# Patient Record
Sex: Female | Born: 2000
Health system: Southern US, Community
[De-identification: ages and names within clinical notes are randomized; demographics above are authoritative.]

## PROBLEM LIST (undated history)

## (undated) DIAGNOSIS — F909 Attention-deficit hyperactivity disorder, unspecified type: Secondary | ICD-10-CM

## (undated) DIAGNOSIS — L0231 Cutaneous abscess of buttock: Secondary | ICD-10-CM

## (undated) DIAGNOSIS — J45909 Unspecified asthma, uncomplicated: Secondary | ICD-10-CM

## (undated) DIAGNOSIS — O24419 Gestational diabetes mellitus in pregnancy, unspecified control: Secondary | ICD-10-CM

## (undated) HISTORY — DX: Gestational diabetes mellitus in pregnancy, unspecified control: O24.419

## (undated) HISTORY — PX: INCISION AND DRAINAGE ABSCESS: SHX5864

## (undated) HISTORY — PX: TONSILLECTOMY: SUR1361

## (undated) HISTORY — PX: DENTAL SURGERY: SHX609

---
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
## 2020-08-18 IMAGING — US US MFM FETAL BPP W/O NON-STRESS
1 series · 15 of 28 positions shown · non-contrast
Comparison: none

[Series 1: us mfm fetal bpp w/o non-stress · 30 acquisitions, 15 frames shown]
[im 1/30]
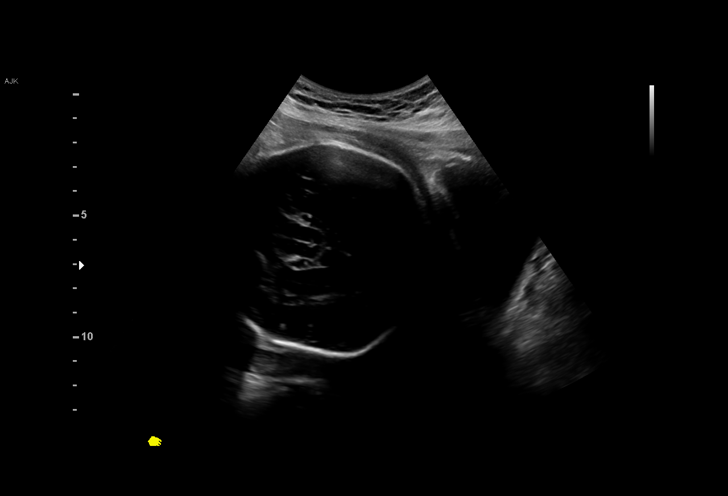
[im 3/30]
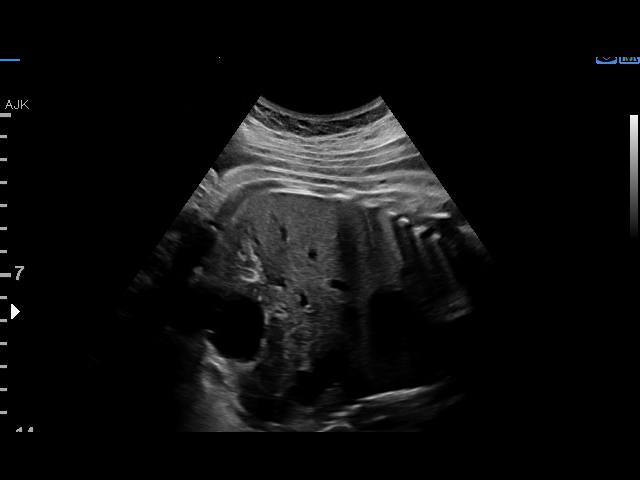
[im 5/30]
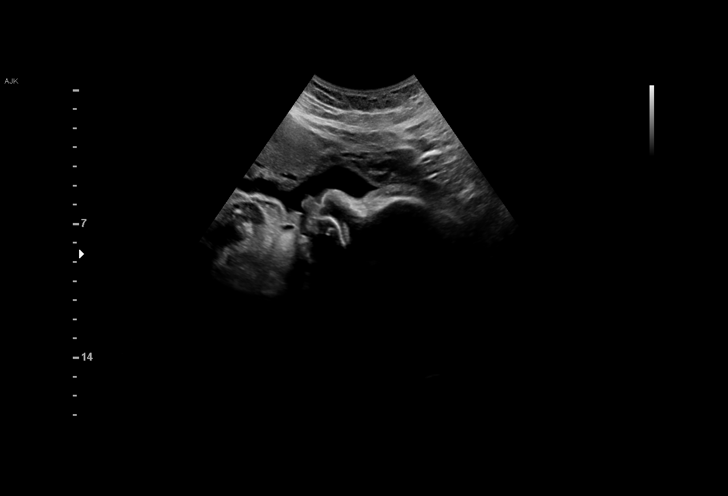
[im 7/30]
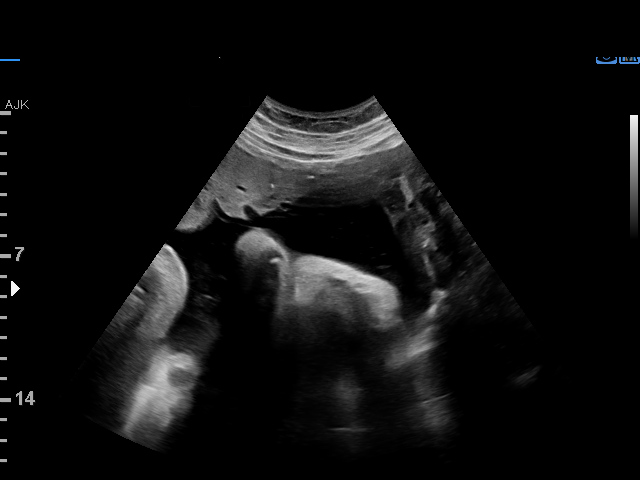
[im 9/30]
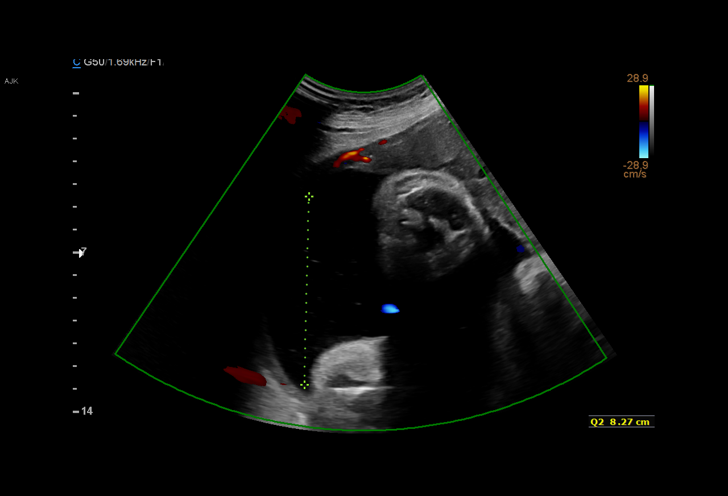
[im 11/30]
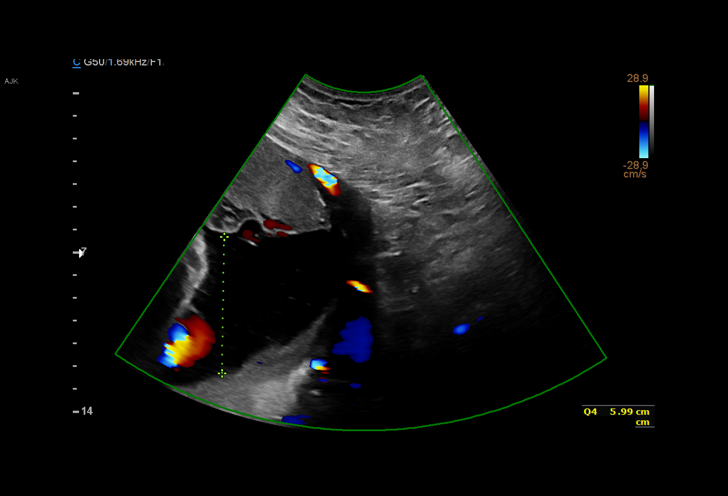
[im 13/30]
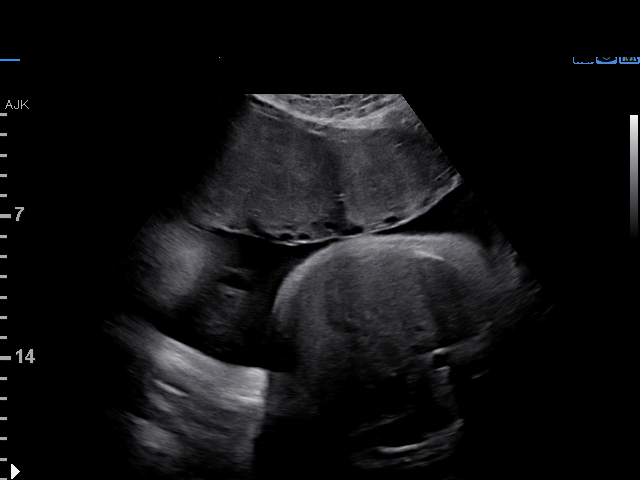
[im 16/30]
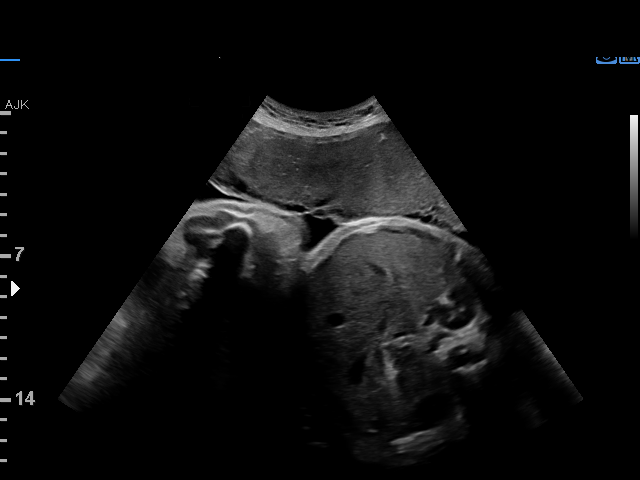
[im 17/30]
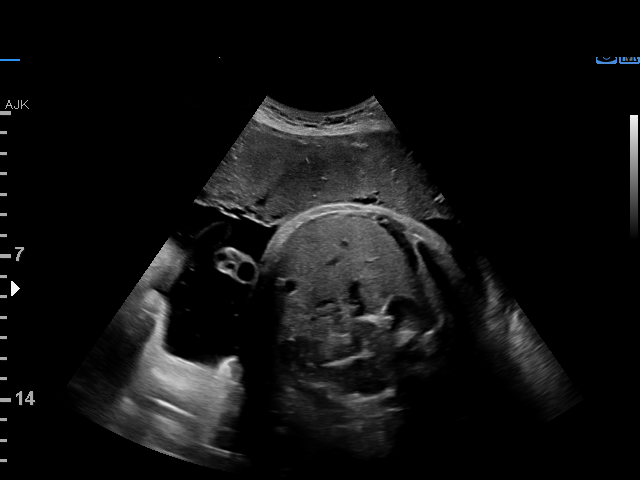
[im 19/30]
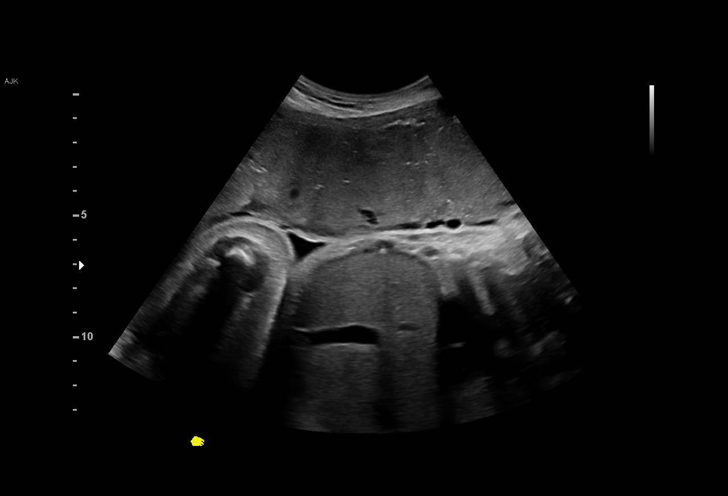
[im 21/30]
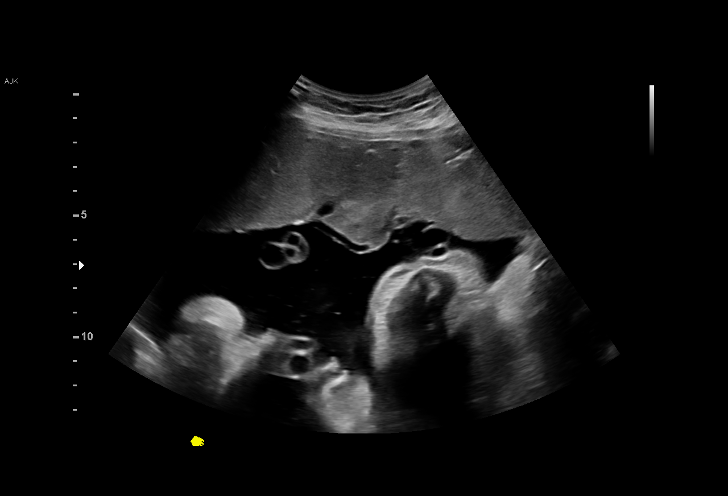
[im 23/30]
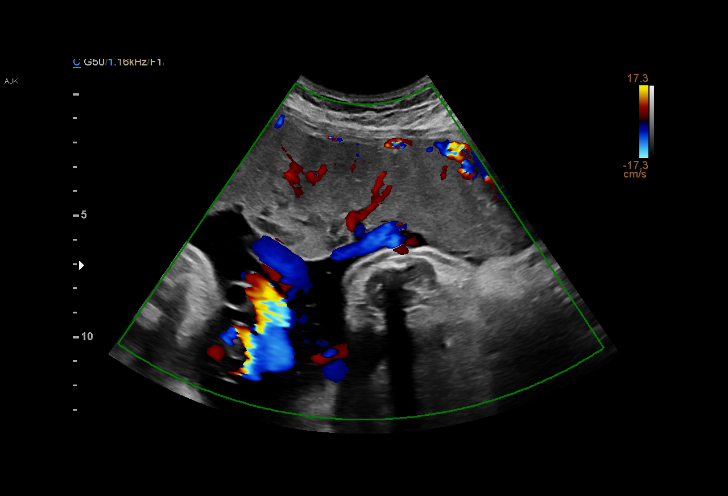
[im 25/30]
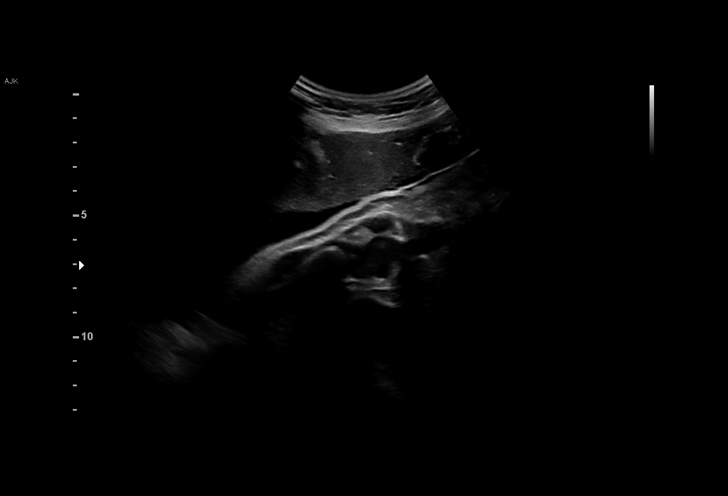
[im 27/30]
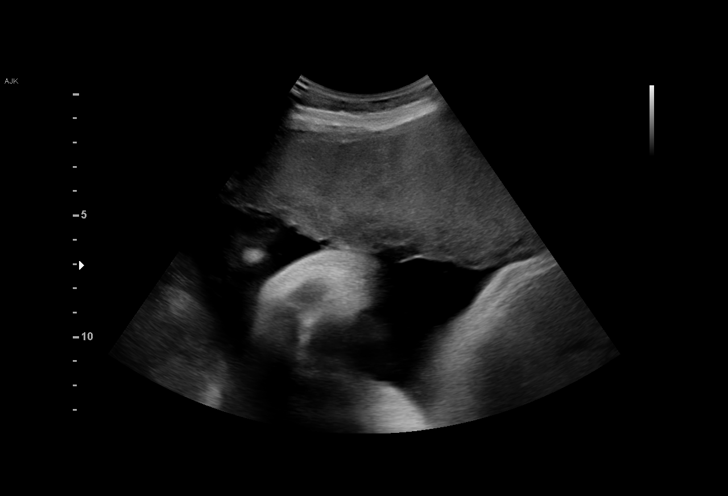
[im 30/30]
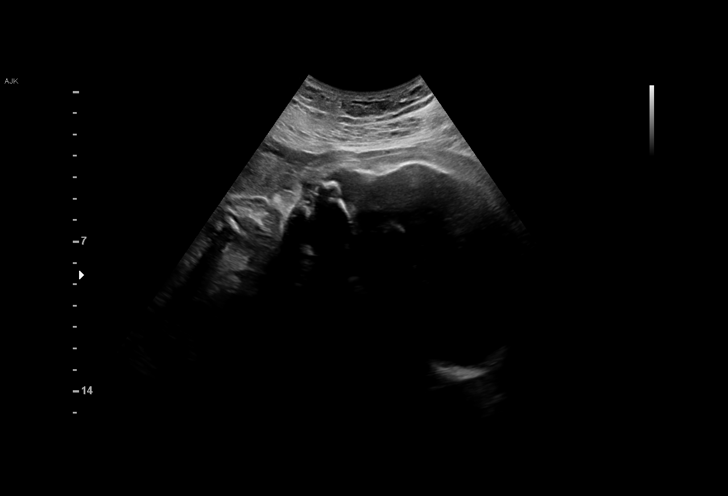

[15 of 28 positions shown; findings below may reference images not displayed]

Attending:        Kotachi J Pando      Secondary Phy.:    [facility]
                                                             Care

Indications

 35 weeks gestation of pregnancy
 Gestational diabetes in pregnancy, diet
 controlled
 Polyhydramnios, third trimester, antepartum
 condition or complication, unspecified fetus
 Genetic carrier (Leydis Gaitan)
 Uterine size-date discrepancy, third trimester
 Asthma                                          5MM.VM [id number]
Fetal Evaluation

 Num Of Fetuses:          1
 Fetal Heart Rate(bpm):   142
 Cardiac Activity:        Observed
 Presentation:            Cephalic
 Placenta:                Anterior
 P. Cord Insertion:       Visualized, central

 Amniotic Fluid
 AFI FV:      Within normal limits

 AFI Sum(cm)     %Tile       Largest Pocket(cm)
 21.9            83

 RUQ(cm)       RLQ(cm)       LUQ(cm)        LLQ(cm)
 5.3           6

 Comment:    Stomach, bladder, and diaphragm seen.
Biophysical Evaluation

 Amniotic F.V:   Within normal limits       F. Tone:         Observed
 F. Movement:    Observed                   Score:           [DATE]
 F. Breathing:   Observed
OB History

 Gravidity:    1         Term:   0        Prem:   0        SAB:   0
 TOP:          0       Ectopic:  0        Living: 0
Gestational Age

 LMP:           35w 3d        Date:  11/13/18                 EDD:   08/20/19
 Best:          35w 3d     Det. By:  LMP  (11/13/18)          EDD:   08/20/19
Impression

 Biphysical profile performed due to GDM and polyhydramnios
 Was on insulin earlier in the pregnancy but discontinued due
 to improved blood glucose levels.
 Biophysical profile [DATE]
 Good fetal movement and aminotic fluid
Recommendations

 Follow up growth and BPP scheduled on [DATE]

## 2020-08-23 ENCOUNTER — Ambulatory Visit (INDEPENDENT_AMBULATORY_CARE_PROVIDER_SITE_OTHER): Payer: Medicaid Other

## 2020-08-23 DIAGNOSIS — Z3201 Encounter for pregnancy test, result positive: Secondary | ICD-10-CM

## 2020-08-23 LAB — POCT PREGNANCY, URINE: Preg Test, Ur: POSITIVE — AB

## 2020-08-23 MED ORDER — PRENATAL PLUS 27-1 MG PO TABS: 1.0000 | ORAL_TABLET | Freq: Every day | ORAL | 6 refills | Status: AC

## 2020-08-23 NOTE — Progress Notes (Signed)
I have reviewed the findings and care plan as documented in the RN note.    Casper Harrison, MD [facility] Family Medicine Fellow, [facility] for [facility], [facility]

## 2020-08-23 NOTE — Progress Notes (Signed)
Pt dropped off urine for UPT. Called pt and Spoke with pt. Pt given results that UPT was positive in office today.Pt verbalized understanding. Pt states has taken 2 home UPT that were positive. Pt advised to call and make new OB appt with [facility]. Pt verbalized understanding.   LMP: 06/18/20 EDD 03/25/2021 [redacted]w[redacted]d today   Pt denies any cramps, abd pain and vaginal bleeding. Ectopic precautions advised and when to go to [facility]. Pt agreeable to plan of care. Pt advised to start taking PNV. Pt requesting PNV Rx. Sent to pharmacy on file.   Judeth Cornfield, RN

## 2020-09-04 ENCOUNTER — Other Ambulatory Visit: Payer: Self-pay

## 2020-09-04 ENCOUNTER — Telehealth (INDEPENDENT_AMBULATORY_CARE_PROVIDER_SITE_OTHER): Payer: Medicaid Other | Admitting: *Deleted

## 2020-09-04 DIAGNOSIS — O0990 Supervision of high risk pregnancy, unspecified, unspecified trimester: Secondary | ICD-10-CM | POA: Insufficient documentation

## 2020-09-04 DIAGNOSIS — Z3A00 Weeks of gestation of pregnancy not specified: Secondary | ICD-10-CM

## 2020-09-04 DIAGNOSIS — Z8759 Personal history of other complications of pregnancy, childbirth and the puerperium: Secondary | ICD-10-CM

## 2020-09-04 DIAGNOSIS — O99419 Diseases of the circulatory system complicating pregnancy, unspecified trimester: Secondary | ICD-10-CM

## 2020-09-04 DIAGNOSIS — Z8632 Personal history of gestational diabetes: Secondary | ICD-10-CM | POA: Insufficient documentation

## 2020-09-04 DIAGNOSIS — O09299 Supervision of pregnancy with other poor reproductive or obstetric history, unspecified trimester: Secondary | ICD-10-CM | POA: Insufficient documentation

## 2020-09-04 DIAGNOSIS — I471 Supraventricular tachycardia, unspecified: Secondary | ICD-10-CM

## 2020-09-04 HISTORY — DX: Supervision of high risk pregnancy, unspecified, unspecified trimester: O09.90

## 2020-09-04 MED ORDER — BLOOD PRESSURE KIT DEVI: 1.0000 | 0 refills | Status: DC | PRN

## 2020-09-04 MED ORDER — GOJJI WEIGHT SCALE MISC: 1.0000 | 0 refills | Status: DC | PRN

## 2020-09-04 NOTE — Progress Notes (Signed)
11:23 patient not connected for virtual visit. I called her and asked if she is ready and gave her instructions to join. Antolin Belsito,RN  New OB Intake  I connected with  Lenord Carbo [place] on 09/04/20 at 11:25 by MyChart Video Visit and verified that I am speaking with the correct person using two identifiers. Nurse is located at [facility] and pt is located in her car.  I discussed the limitations, risks, security and privacy concerns of performing an evaluation and management service by telephone and the availability of in person appointments. I also discussed with the patient that there may be a patient responsible charge related to this service. The patient expressed understanding and agreed to proceed.  I explained I am completing New OB Intake today. We discussed her EDD of 03/25/21 that is based on LMP of 06/18/20. Pt is G2/P1001. I reviewed her allergies, medications, Medical/Surgical/OB history, and appropriate screenings. I informed her of [facility] services. Based on history, this is a/an  pregnancy complicated by history of either GHT or Preeclampsia and hx of GDM.   And SVT.   Patient Active Problem List   Diagnosis Date Noted   Supervision of high risk pregnancy, antepartum 09/04/2020   History of gestational hypertension 09/04/2020   History of gestational diabetes mellitus (GDM) in prior pregnancy, currently pregnant 09/04/2020   SVT (supraventricular tachycardia) (HCC) 06/21/2019   Alpha thalassemia silent carrier 04/06/2019    Concerns addressed today  Of note was having some issues with phone battery and reception during visit so visit was expedited.  Delivery Plans:  Plans to deliver at [facility].   MyChart/Babyscripts MyChart access verified. I explained pt will have some visits in office and some virtually. Babyscripts instructions given and order placed.   Blood Pressure Cuff  Blood pressure cuff ordered for patient to pick-up from [employer]. Explained after first  prenatal appt pt will check weekly and document in Babyscripts.  Weight scale: Patient  does not have weight scale. Weight scale ordered for patient to pick up.   Anatomy US Explained first scheduled [place] will be around 19 weeks and patient notified by MyChart.   Labs Discussed Avelina Laine genetic screening with patient. Declines  both Panorama and Horizon drawn at new OB visit. Routine prenatal labs needed.  Covid Vaccine Patient has not had covid vaccine.   Mother/ Baby Dyad Candidate?   NO If yes, offer as possibility  Inform patient of [facility] and place . In AVS   Social Determinants of Health Food Insecurity: Patient denies food insecurity. WIC Referral: Patient is not interested in referral to [facility]. States she has WIC.  Transportation: Patient denies transportation needs. Childcare: Discussed no children allowed at ultrasound appointments. Offered childcare services; patient declines childcare services at this time.  First visit review I reviewed new OB appt with pt. I explained she will have a pelvic exam, ob bloodwork , and PAP smear. NOted during visit was scheduled to see Midwife, changed to MD due to high risk pregnancy noted. Explained pt will be seen by Dr. Crissie Reese at first visit; encounter routed to appropriate provider. Explained that patient will be seen by pregnancy navigator following visit with provider. [facility] information placed in AVS.   Breindel Collier,RN 09/04/2020  12:09 PM

## 2020-09-04 NOTE — Patient Instructions (Addendum)
  At our [facility] OB/GYN Practices, we work as an integrated team, providing care to address both physical and emotional health. Your medical provider may refer you to see our Behavioral Health Clinician [facility]) on the same day you see your medical provider, as availability permits; often scheduled virtually at your convenience.  Our [facility] is available to all patients, visits generally last between 20-30 minutes, but can be longer or shorter, depending on patient need. The [facility] offers help with stress management, coping with symptoms of depression and anxiety, major life changes , sleep issues, changing risky behavior, grief and loss, life stress, working on personal life goals, and  behavioral health issues, as these all affect your overall health and wellness.  The [facility] is NOT available for the following: FMLA paperwork, court-ordered evaluations, specialty assessments (custody or disability), letters to employers, or obtaining certification for an emotional support animal. The [facility] does not provide long-term therapy. You have the right to refuse integrated behavioral health services, or to reschedule to see the [facility] at a later date.  Confidentiality exception: If it is suspected that a child or disabled adult is being abused or neglected, we are required by law to report that to either Child Protective Services or Adult Management consultant.  If you have a diagnosis of Bipolar affective disorder, Schizophrenia, or recurrent Major depressive disorder, we will recommend that you establish care with a psychiatrist, as these are lifelong, chronic conditions, and we want your overall emotional health and medications to be more closely monitored. If you anticipate needing extended maternity leave due to mental health issues postpartum, it it recommended you inform your medical provider, so we can put in a referral to a psychiatrist as soon as possible. The [facility] is unable to recommend an extended maternity leave for mental  health issues. Your medical provider or [facility] may refer you to a therapist for ongoing, traditional therapy, or to a psychiatrist, for medication management, if it would benefit your overall health. Depending on your insurance, you may have a copay or be charged a deductible, depending on your insurance, to see the [facility]. If you are uninsured, it is recommended that you apply for financial assistance. (Forms may be requested at the front desk for in-person visits, via MyChart, or request a form during a virtual visit).  If you see the [facility] more than 6 times, you will have to complete a comprehensive clinical assessment interview with the [facility] to resume integrated services.  For virtual visits with the [facility], you must be physically in the state of [place] at the time of the visit. For example, if you live in [place], you will have to do an in-person visit with the [facility], and your out-of-state insurance may not cover behavioral health services in [place]. If you are going out of the state or country for any reason, the [facility] may see you virtually when you return to [place], but not while you are physically outside of [place].     [email or web address] is a Chief Technology Officer for pregnancy

## 2020-09-14 ENCOUNTER — Encounter (HOSPITAL_COMMUNITY): Payer: Self-pay | Admitting: Family Medicine

## 2020-09-14 ENCOUNTER — Inpatient Hospital Stay (HOSPITAL_COMMUNITY)
Admission: AD | Admit: 2020-09-14 | Discharge: 2020-09-15 | Disposition: A | Discharge status: Home / Self Care | Payer: Medicaid Other | Attending: Family Medicine | Admitting: Family Medicine

## 2020-09-14 DIAGNOSIS — O210 Mild hyperemesis gravidarum: Secondary | ICD-10-CM | POA: Diagnosis not present

## 2020-09-14 DIAGNOSIS — Z7722 Contact with and (suspected) exposure to environmental tobacco smoke (acute) (chronic): Secondary | ICD-10-CM | POA: Diagnosis not present

## 2020-09-14 DIAGNOSIS — Z8759 Personal history of other complications of pregnancy, childbirth and the puerperium: Secondary | ICD-10-CM

## 2020-09-14 DIAGNOSIS — O0990 Supervision of high risk pregnancy, unspecified, unspecified trimester: Secondary | ICD-10-CM

## 2020-09-14 DIAGNOSIS — Z79899 Other long term (current) drug therapy: Secondary | ICD-10-CM | POA: Diagnosis not present

## 2020-09-14 DIAGNOSIS — O219 Vomiting of pregnancy, unspecified: Secondary | ICD-10-CM | POA: Insufficient documentation

## 2020-09-14 DIAGNOSIS — Z3A12 12 weeks gestation of pregnancy: Secondary | ICD-10-CM | POA: Diagnosis not present

## 2020-09-14 DIAGNOSIS — O09299 Supervision of pregnancy with other poor reproductive or obstetric history, unspecified trimester: Secondary | ICD-10-CM

## 2020-09-14 LAB — CBC
HCT: 46.4 % — ABNORMAL HIGH (ref 36.0–46.0)
Hemoglobin: 15.2 g/dL — ABNORMAL HIGH (ref 12.0–15.0)
MCH: 23.7 pg — ABNORMAL LOW (ref 26.0–34.0)
MCHC: 32.8 g/dL (ref 30.0–36.0)
MCV: 72.4 fL — ABNORMAL LOW (ref 80.0–100.0)
Platelets: 385 10*3/uL (ref 150–400)
RBC: 6.41 MIL/uL — ABNORMAL HIGH (ref 3.87–5.11)
RDW: 15 % (ref 11.5–15.5)
WBC: 15.7 10*3/uL — ABNORMAL HIGH (ref 4.0–10.5)
nRBC: 0 % (ref 0.0–0.2)

## 2020-09-14 LAB — COMPREHENSIVE METABOLIC PANEL
ALT: 22 U/L (ref 0–44)
AST: 22 U/L (ref 15–41)
Albumin: 4.3 g/dL (ref 3.5–5.0)
Alkaline Phosphatase: 59 U/L (ref 38–126)
Anion gap: 13 (ref 5–15)
BUN: 8 mg/dL (ref 6–20)
CO2: 17 mmol/L — ABNORMAL LOW (ref 22–32)
Calcium: 9.8 mg/dL (ref 8.9–10.3)
Chloride: 107 mmol/L (ref 98–111)
Creatinine, Ser: 0.63 mg/dL (ref 0.44–1.00)
GFR, Estimated: >60 mL/min (ref >60)
Glucose, Bld: 77 mg/dL (ref 70–99)
Potassium: 3.8 mmol/L (ref 3.5–5.1)
Sodium: 137 mmol/L (ref 135–145)
Total Bilirubin: 0.6 mg/dL (ref 0.3–1.2)
Total Protein: 7.8 g/dL (ref 6.5–8.1)

## 2020-09-14 LAB — URINALYSIS, ROUTINE W REFLEX MICROSCOPIC
Bilirubin Urine: NEGATIVE
Glucose, UA: NEGATIVE mg/dL
Hgb urine dipstick: NEGATIVE
Ketones, ur: 80 mg/dL — AB
Leukocytes,Ua: NEGATIVE
Nitrite: NEGATIVE
Protein, ur: 30 mg/dL — AB
Specific Gravity, Urine: 1.033 — ABNORMAL HIGH (ref 1.005–1.030)
pH: 6 (ref 5.0–8.0)

## 2020-09-14 MED ORDER — LACTATED RINGERS IV BOLUS
1000.0000 mL | Freq: Once | INTRAVENOUS | Status: AC
  Administered 2020-09-14: 1000 mL via INTRAVENOUS

## 2020-09-14 MED ORDER — SODIUM CHLORIDE 0.9 % IV SOLN
25.0000 mg | Freq: Once | INTRAVENOUS | Status: AC
  Administered 2020-09-14: 25 mg via INTRAVENOUS
  Filled 2020-09-14: qty 1

## 2020-09-14 NOTE — MAU Note (Signed)
Unable to keep down anything today. Have a headache behind my eyes and some lower abd cramping. Denies VB or d/c. Occ feel like my heart is "jumping" and sometimes my chest is tight.

## 2020-09-15 MED ORDER — PROMETHAZINE HCL 25 MG PO TABS: 25.0000 mg | ORAL_TABLET | Freq: Four times a day (QID) | ORAL | 1 refills | Status: DC | PRN

## 2020-09-15 NOTE — Progress Notes (Signed)
WRitten and verbal d/c instructions given and understanding vocied.

## 2020-09-15 NOTE — MAU Provider Note (Signed)
Chief Complaint:  Emesis During Pregnancy   Event Date/Time   First Provider Initiated Contact with Patient 09/14/20 2244     HPI: Natalie Pacheco is a 20 y.o. G2P1001 at [redacted]w[redacted]d who presents to maternity admissions reporting nausea and vomiting that started about 2 weeks ago, however today it has worsened and she reports she cannot keep any PO fluids down. She reports she has tried to drink water, juice, soda. She is not currently on any medications for nausea. She denies abdominal pain, vaginal bleeding, urinary s/s, fever.    Pregnancy Course:   Past Medical History:  Diagnosis Date   Abscess of buttock    ADHD (attention deficit hyperactivity disorder)    Asthma    DKA (diabetic ketoacidosis) ([place]) 06/16/2019   Gestational diabetes    OB History  Gravida Para Term Preterm AB Living  $Remov'2 1 1     1  'wKxaJu$ SAB IAB Ectopic Multiple Live Births        0 1    # Outcome Date GA Lbr Len/2nd Weight Sex Delivery Anes PTL Lv  2 Current           1 Term 08/10/19 [redacted]w[redacted]d 16:12 / 07:17 3751 g F Vag-Vacuum EPI  LIV   Past Surgical History:  Procedure Laterality Date   CYST EXCISION N/A 07/17/2017   Procedure: EXCISION OF INFECTED GLUTEAL CYST;  Surgeon: Clovis Riley, MD;  Location: [facility];  Service: General;  Laterality: N/A;   DENTAL SURGERY     INCISION AND DRAINAGE ABSCESS     Buttocks   TONSILLECTOMY     History reviewed. No pertinent family history. Social History   Tobacco Use   Smoking status: Never    Passive exposure: Yes   Smokeless tobacco: Never   Tobacco comments:    Mother smokes outside of the home  Vaping Use   Vaping Use: Never used  Substance Use Topics   Alcohol use: No   Drug use: Never   No Known Allergies Medications Prior to Admission  Medication Sig Dispense Refill Last Dose   albuterol (PROVENTIL) (2.5 MG/3ML) 0.083% nebulizer solution Take 2.5 mg by nebulization every 6 (six) hours as needed for wheezing or shortness of breath.       albuterol (VENTOLIN HFA) 108 (90 Base) MCG/ACT inhaler Inhale 1-2 puffs into the lungs every 6 (six) hours as needed for wheezing or shortness of breath. 6.7 g 46.7 More than a month   Blood Pressure Monitoring (BLOOD PRESSURE KIT) DEVI 1 Device by Does not apply route as needed. 1 each 0    famotidine (PEPCID) 40 MG tablet Take 1 tablet (40 mg total) by mouth daily. 90 tablet 1    Misc. Devices (GOJJI WEIGHT SCALE) MISC 1 Device by Does not apply route as needed. 1 each 0    prenatal vitamin w/FE, FA (PRENATAL 1 + 1) 27-1 MG TABS tablet Take 1 tablet by mouth daily at 12 noon. 30 tablet 6     I have reviewed patient's Past Medical Hx, Surgical Hx, Family Hx, Social Hx, medications and allergies.   ROS:  Review of Systems  Constitutional: Negative.   Respiratory: Negative.    Gastrointestinal:  Positive for nausea and vomiting. Negative for abdominal pain and diarrhea.  Genitourinary: Negative.   Musculoskeletal: Negative.   Neurological: Negative.   Psychiatric/Behavioral: Negative.     Physical Exam  Patient Vitals for the past 24 hrs:  BP Temp Pulse Resp SpO2 Height Weight  09/15/20 0116 122/64 -- 96 16 -- -- --  09/14/20 2031 120/70 99.5 F (37.5 C) 91 16 99 % $Re'5\' 4"'SSf$  (1.626 m) 83.9 kg   Constitutional: well-developed, well-nourished female in no acute distress.  Cardiovascular: normal rate Respiratory: normal effort GI: abd soft, non-tender MS: extremities nontender, no edema, normal ROM Neurologic: alert and oriented x 4.  GU: neg CVAT. Pelvic: deferred    FHT: 170 bpm via doppler   Labs: Results for orders placed or performed during the hospital encounter of 09/14/20 (from the past 24 hour(s))  Urinalysis, Routine w reflex microscopic Urine, Clean Catch     Status: Abnormal   Collection Time: 09/14/20  8:39 PM  Result Value Ref Range   Color, Urine AMBER (A) YELLOW   APPearance HAZY (A) CLEAR   Specific Gravity, Urine 1.033 (H) 1.005 - 1.030   pH 6.0 5.0 - 8.0    Glucose, UA NEGATIVE NEGATIVE mg/dL   Hgb urine dipstick NEGATIVE NEGATIVE   Bilirubin Urine NEGATIVE NEGATIVE   Ketones, ur 80 (A) NEGATIVE mg/dL   Protein, ur 30 (A) NEGATIVE mg/dL   Nitrite NEGATIVE NEGATIVE   Leukocytes,Ua NEGATIVE NEGATIVE   RBC / HPF 0-5 0 - 5 RBC/hpf   WBC, UA 0-5 0 - 5 WBC/hpf   Bacteria, UA RARE (A) NONE SEEN   Squamous Epithelial / LPF 0-5 0 - 5   Mucus PRESENT    Hyaline Casts, UA PRESENT   CBC     Status: Abnormal   Collection Time: 09/14/20 10:45 PM  Result Value Ref Range   WBC 15.7 (H) 4.0 - 10.5 K/uL   RBC 6.41 (H) 3.87 - 5.11 MIL/uL   Hemoglobin 15.2 (H) 12.0 - 15.0 g/dL   HCT 46.4 (H) 36.0 - 46.0 %   MCV 72.4 (L) 80.0 - 100.0 fL   MCH 23.7 (L) 26.0 - 34.0 pg   MCHC 32.8 30.0 - 36.0 g/dL   RDW 15.0 11.5 - 15.5 %   Platelets 385 150 - 400 K/uL   nRBC 0.0 0.0 - 0.2 %  Comprehensive metabolic panel     Status: Abnormal   Collection Time: 09/14/20 10:45 PM  Result Value Ref Range   Sodium 137 135 - 145 mmol/L   Potassium 3.8 3.5 - 5.1 mmol/L   Chloride 107 98 - 111 mmol/L   CO2 17 (L) 22 - 32 mmol/L   Glucose, Bld 77 70 - 99 mg/dL   BUN 8 6 - 20 mg/dL   Creatinine, Ser 0.63 0.44 - 1.00 mg/dL   Calcium 9.8 8.9 - 10.3 mg/dL   Total Protein 7.8 6.5 - 8.1 g/dL   Albumin 4.3 3.5 - 5.0 g/dL   AST 22 15 - 41 U/L   ALT 22 0 - 44 U/L   Alkaline Phosphatase 59 38 - 126 U/L   Total Bilirubin 0.6 0.3 - 1.2 mg/dL   GFR, Estimated >60 >60 mL/min   Anion gap 13 5 - 15    Imaging:  No results found.  MAU Course: Orders Placed This Encounter  Procedures   OB Urine Culture   Urinalysis, Routine w reflex microscopic Urine, Clean Catch   CBC   Comprehensive metabolic panel   Discharge patient   Meds ordered this encounter  Medications   promethazine (PHENERGAN) 25 mg in sodium chloride 0.9 % 1,000 mL infusion   lactated ringers bolus 1,000 mL     MDM: UA and culture pending Phenergan infusion with LR followed by LR bolus  CBC and CMP  unremarkable Patient tolerating PO fluids    Assessment: Nausea and vomiting during pregnancy prior to [redacted] weeks gestation [redacted] weeks gestation of pregnancy   Plan: Discharge home in stable condition.  Rx for phenergan sent to pharmacy Keep OB appointment as scheduled on 7/5 Return to MAU as needed    Allergies as of 09/15/2020   No Known Allergies      Medication List     TAKE these medications    albuterol 108 (90 Base) MCG/ACT inhaler Commonly known as: VENTOLIN HFA Inhale 1-2 puffs into the lungs every 6 (six) hours as needed for wheezing or shortness of breath.   albuterol (2.5 MG/3ML) 0.083% nebulizer solution Commonly known as: PROVENTIL Take 2.5 mg by nebulization every 6 (six) hours as needed for wheezing or shortness of breath.   Blood Pressure Kit Devi 1 Device by Does not apply route as needed.   famotidine 40 MG tablet Commonly known as: Pepcid Take 1 tablet (40 mg total) by mouth daily.   Gojji Weight Scale Misc 1 Device by Does not apply route as needed.   prenatal vitamin w/FE, FA 27-1 MG Tabs tablet Take 1 tablet by mouth daily at 12 noon.   promethazine 25 MG tablet Commonly known as: PHENERGAN Take 1 tablet (25 mg total) by mouth every 6 (six) hours as needed for nausea or vomiting.          Maryagnes Amos, MSN, CNM 09/15/2020 1:47 AM

## 2020-09-16 LAB — CULTURE, OB URINE

## 2020-09-18 ENCOUNTER — Ambulatory Visit (INDEPENDENT_AMBULATORY_CARE_PROVIDER_SITE_OTHER): Payer: Medicaid Other | Admitting: Family Medicine

## 2020-09-18 ENCOUNTER — Encounter: Payer: Self-pay | Admitting: Family Medicine

## 2020-09-18 ENCOUNTER — Other Ambulatory Visit: Payer: Self-pay

## 2020-09-18 VITALS — BP 123/64 | HR 79 | Wt 184.0 lb

## 2020-09-18 DIAGNOSIS — J4520 Mild intermittent asthma, uncomplicated: Secondary | ICD-10-CM

## 2020-09-18 DIAGNOSIS — O09299 Supervision of pregnancy with other poor reproductive or obstetric history, unspecified trimester: Secondary | ICD-10-CM

## 2020-09-18 DIAGNOSIS — Z8759 Personal history of other complications of pregnancy, childbirth and the puerperium: Secondary | ICD-10-CM

## 2020-09-18 DIAGNOSIS — I471 Supraventricular tachycardia: Secondary | ICD-10-CM

## 2020-09-18 DIAGNOSIS — O0990 Supervision of high risk pregnancy, unspecified, unspecified trimester: Secondary | ICD-10-CM

## 2020-09-18 DIAGNOSIS — D563 Thalassemia minor: Secondary | ICD-10-CM

## 2020-09-18 DIAGNOSIS — Z8632 Personal history of gestational diabetes: Secondary | ICD-10-CM

## 2020-09-18 LAB — POCT URINALYSIS DIP (DEVICE)
Glucose, UA: NEGATIVE mg/dL
Hgb urine dipstick: NEGATIVE
Ketones, ur: >=160 mg/dL — AB
Leukocytes,Ua: NEGATIVE
Nitrite: NEGATIVE
Protein, ur: 30 mg/dL — AB
Specific Gravity, Urine: >=1.03 (ref 1.005–1.030)
Urobilinogen, UA: 0.2 mg/dL (ref 0.0–1.0)
pH: 6 (ref 5.0–8.0)

## 2020-09-18 MED ORDER — ONDANSETRON 4 MG PO TBDP: 4.0000 mg | ORAL_TABLET | Freq: Three times a day (TID) | ORAL | 1 refills | Status: DC | PRN

## 2020-09-18 MED ORDER — ASPIRIN EC 81 MG PO TBEC: 81.0000 mg | DELAYED_RELEASE_TABLET | Freq: Every day | ORAL | 11 refills | Status: DC

## 2020-09-18 NOTE — Progress Notes (Signed)
Subjective:   Natalie Pacheco is a 20 y.o. G2P1001 at [redacted]w[redacted]d by LMP being seen today for her first obstetrical visit.  Her obstetrical history is significant for pregnancy induced hypertension and GDM . Patient does intend to breast feed. Pregnancy history fully reviewed.  Patient reports no complaints.  HISTORY: OB History  Gravida Para Term Preterm AB Living  $Remov'2 1 1 'yYuizc$ 0 0 1  SAB IAB Ectopic Multiple Live Births  0 0 0 0 1    # Outcome Date GA Lbr Len/2nd Weight Sex Delivery Anes PTL Lv  2 Current           1 Term 08/10/19 [redacted]w[redacted]d 16:12 / 07:17 8 lb 4.3 oz (3.751 kg) F Vag-Vacuum EPI  LIV     Name: Harbison,GIRL Pauleen     Apgar1: 7  Apgar5: 9   Last pap smear was: n/a, 20 y/o Past Medical History:  Diagnosis Date   Abscess of buttock    ADHD (attention deficit hyperactivity disorder)    Asthma    DKA (diabetic ketoacidosis) ([place]) 06/16/2019   Gestational diabetes    Past Surgical History:  Procedure Laterality Date   CYST EXCISION N/A 07/17/2017   Procedure: EXCISION OF INFECTED GLUTEAL CYST;  Surgeon: Clovis Riley, MD;  Location: [facility];  Service: General;  Laterality: N/A;   DENTAL SURGERY     INCISION AND DRAINAGE ABSCESS     Buttocks   TONSILLECTOMY     History reviewed. No pertinent family history. Social History   Tobacco Use   Smoking status: Never    Passive exposure: Yes   Smokeless tobacco: Never   Tobacco comments:    Mother smokes outside of the home  Vaping Use   Vaping Use: Never used  Substance Use Topics   Alcohol use: No   Drug use: Never   No Known Allergies Current Outpatient Medications on File Prior to Visit  Medication Sig Dispense Refill   albuterol (PROVENTIL) (2.5 MG/3ML) 0.083% nebulizer solution Take 2.5 mg by nebulization every 6 (six) hours as needed for wheezing or shortness of breath.     albuterol (VENTOLIN HFA) 108 (90 Base) MCG/ACT inhaler Inhale 1-2 puffs into the lungs every 6 (six) hours as needed  for wheezing or shortness of breath. 6.7 g 46.7   Blood Pressure Monitoring (BLOOD PRESSURE KIT) DEVI 1 Device by Does not apply route as needed. 1 each 0   Misc. Devices (GOJJI WEIGHT SCALE) MISC 1 Device by Does not apply route as needed. 1 each 0   prenatal vitamin w/FE, FA (PRENATAL 1 + 1) 27-1 MG TABS tablet Take 1 tablet by mouth daily at 12 noon. 30 tablet 6   promethazine (PHENERGAN) 25 MG tablet Take 1 tablet (25 mg total) by mouth every 6 (six) hours as needed for nausea or vomiting. 30 tablet 1   famotidine (PEPCID) 40 MG tablet Take 1 tablet (40 mg total) by mouth daily. (Patient not taking: Reported on 09/18/2020) 90 tablet 1   No current facility-administered medications on file prior to visit.     Exam   Vitals:   09/18/20 0958  BP: 123/64  Pulse: 79  Weight: 184 lb (83.5 kg)   Fetal Heart Rate (bpm): 172  System: General: well-developed, well-nourished female in no acute distress   Skin: normal coloration and turgor, no rashes   Neurologic: oriented, normal, negative, normal mood   Extremities: normal strength, tone, and muscle mass, ROM of all  joints is normal   HEENT PERRLA, extraocular movement intact and sclera clear, anicteric   Neck supple and no masses   Respiratory:  no respiratory distress     Assessment:   Pregnancy: G2P1001 Patient Active Problem List   Diagnosis Date Noted   Supervision of high risk pregnancy, antepartum 09/04/2020   History of gestational hypertension 09/04/2020   History of gestational diabetes mellitus (GDM) in prior pregnancy, currently pregnant 09/04/2020   SVT (supraventricular tachycardia) ([place]) 06/21/2019   Alpha thalassemia silent carrier 04/06/2019   ADHD, predominantly inattentive type 04/14/2015   Mild intermittent asthma without complication 17/83/7542     Plan:  1. Supervision of high risk pregnancy, antepartum BP normal, FHR wnl Initial labs drawn. Continue prenatal vitamins. Genetic Screening discussed, NIPS:  ordered. Ultrasound discussed; fetal anatomic survey: ordered. Problem list reviewed and updated. The nature of [place] with multiple MDs and other Advanced Practice Providers was explained to patient; also emphasized that residents, students are part of our team.  2. History of gestational hypertension Prescribed baby aspirin Baseline labs today  3. History of gestational diabetes mellitus (GDM) in prior pregnancy, currently pregnant Will obtain A1c today Hx of DKA prior pregnancy Will do early 2hr GTT given prior pregnancy had normal A1c of 5% on new OB  4. Alpha thalassemia silent carrier Would like partner testing, requested  5. Mild intermittent asthma without complication Has not used inhaler since Easter  6. SVT (supraventricular tachycardia) (HCC) Seen during admission for DKA during her first pregnancy Cards consulted at that time and no further intervention required as likely driven by her DKA process and ECG was otherwise normal, no need for TTE    Routine obstetric precautions reviewed. Return in 4 weeks (on 10/16/2020) for ob visit.

## 2020-09-18 NOTE — Patient Instructions (Signed)
Second Trimester of Pregnancy °The second trimester of pregnancy is from week 13 through week 27. This is months 4 through 6 of pregnancy. The second trimester is often a time when you feel your best. Your body has adjusted to being pregnant, and you begin to feel better physically. °During the second trimester: °Morning sickness has lessened or stopped completely. °You may have more energy. °You may have an increase in appetite. °The second trimester is also a time when the unborn baby (fetus) is growing rapidly. At the end of the sixth month, the fetus may be up to 12 inches long and weigh about 1½ pounds. You will likely begin to feel the baby move (quickening) between 16 and 20 weeks of pregnancy. °Body changes during your second trimester °Your body continues to go through many changes during your second trimester. The changes vary and generally return to normal after the baby is born. °Physical changes °Your weight will continue to increase. You will notice your lower abdomen bulging out. °You may begin to get stretch marks on your hips, abdomen, and breasts. °Your breasts will continue to grow and to become tender. °Dark spots or blotches (chloasma or mask of pregnancy) may develop on your face. °A dark line from your belly button to the pubic area (linea nigra) may appear. °You may have changes in your hair. These can include thickening of your hair, rapid growth, and changes in texture. Some people also have hair loss during or after pregnancy, or hair that feels dry or thin. °Health changes °You may develop headaches. °You may have heartburn. °You may develop constipation. °You may develop hemorrhoids or swollen, bulging veins (varicose veins). °Your gums may bleed and may be sensitive to brushing and flossing. °You may urinate more often because the fetus is pressing on your bladder. °You may have back pain. This is caused by: °Weight gain. °Pregnancy hormones that are relaxing the joints in your  pelvis. °A shift in weight and the muscles that support your balance. °Follow these instructions at home: °Medicines °Follow your health care provider's instructions regarding medicine use. Specific medicines may be either safe or unsafe to take during pregnancy. Do not take any medicines unless approved by your health care provider. °Take a prenatal vitamin that contains at least 600 micrograms (mcg) of folic acid. °Eating and drinking °Eat a healthy diet that includes fresh fruits and vegetables, whole grains, good sources of protein such as meat, eggs, or tofu, and low-fat dairy products. °Avoid raw meat and unpasteurized juice, milk, and cheese. These carry germs that can harm you and your baby. °You may need to take these actions to prevent or treat constipation: °Drink enough fluid to keep your urine pale yellow. °Eat foods that are high in fiber, such as beans, whole grains, and fresh fruits and vegetables. °Limit foods that are high in fat and processed sugars, such as fried or sweet foods. °Activity °Exercise only as directed by your health care provider. Most people can continue their usual exercise routine during pregnancy. Try to exercise for 30 minutes at least 5 days a week. Stop exercising if you develop contractions in your uterus. °Stop exercising if you develop pain or cramping in the lower abdomen or lower back. °Avoid exercising if it is very hot or humid or if you are at a high altitude. °Avoid heavy lifting. °If you choose to, you may have sex unless your health care provider tells you not to. °Relieving pain and discomfort °Wear a supportive bra   to prevent discomfort from breast tenderness. °Take warm sitz baths to soothe any pain or discomfort caused by hemorrhoids. Use hemorrhoid cream if your health care provider approves. °Rest with your legs raised (elevated) if you have leg cramps or low back pain. °If you develop varicose veins: °Wear support hose as told by your health care  provider. °Elevate your feet for 15 minutes, 3-4 times a day. °Limit salt in your diet. °Safety °Wear your seat belt at all times when driving or riding in a car. °Talk with your health care provider if someone is verbally or physically abusive to you. °Lifestyle °Do not use hot tubs, steam rooms, or saunas. °Do not douche. Do not use tampons or scented sanitary pads. °Avoid cat litter boxes and soil used by cats. These carry germs that can cause birth defects in the baby and possibly loss of the fetus by miscarriage or stillbirth. °Do not use herbal remedies, alcohol, illegal drugs, or medicines that are not approved by your health care provider. Chemicals in these products can harm your baby. °Do not use any products that contain nicotine or tobacco, such as cigarettes, e-cigarettes, and chewing tobacco. If you need help quitting, ask your health care provider. °General instructions °During a routine prenatal visit, your health care provider will do a physical exam and other tests. He or she will also discuss your overall health. Keep all follow-up visits. This is important. °Ask your health care provider for a referral to a local prenatal education class. °Ask for help if you have counseling or nutritional needs during pregnancy. Your health care provider can offer advice or refer you to specialists for help with various needs. °Where to find more information °American Pregnancy Association: americanpregnancy.org °American College of Obstetricians and Gynecologists: acog.org/en/Womens%20Health/Pregnancy °Office on Women's Health: womenshealth.gov/pregnancy °Contact a health care provider if you have: °A headache that does not go away when you take medicine. °Vision changes or you see spots in front of your eyes. °Mild pelvic cramps, pelvic pressure, or nagging pain in the abdominal area. °Persistent nausea, vomiting, or diarrhea. °A bad-smelling vaginal discharge or foul-smelling urine. °Pain when you  urinate. °Sudden or extreme swelling of your face, hands, ankles, feet, or legs. °A fever. °Get help right away if you: °Have fluid leaking from your vagina. °Have spotting or bleeding from your vagina. °Have severe abdominal cramping or pain. °Have difficulty breathing. °Have chest pain. °Have fainting spells. °Have not felt your baby move for the time period told by your health care provider. °Have new or increased pain, swelling, or redness in an arm or leg. °Summary °The second trimester of pregnancy is from week 13 through week 27 (months 4 through 6). °Do not use herbal remedies, alcohol, illegal drugs, or medicines that are not approved by your health care provider. Chemicals in these products can harm your baby. °Exercise only as directed by your health care provider. Most people can continue their usual exercise routine during pregnancy. °Keep all follow-up visits. This is important. °This information is not intended to replace advice given to you by your health care provider. Make sure you discuss any questions you have with your health care provider. °Document Revised: 08/10/2019 Document Reviewed: 06/16/2019 °Elsevier Patient Education © 2022 [employer] Inc. ° °Contraception Choices °Contraception, also called birth control, refers to methods or devices that prevent pregnancy. °Hormonal methods °Contraceptive implant °A contraceptive implant is a thin, plastic tube that contains a hormone that prevents pregnancy. It is different from an intrauterine device (IUD). It   is inserted into the upper part of the arm by a health care provider. Implants can be effective for up to 3 years. °Progestin-only injections °Progestin-only injections are injections of progestin, a synthetic form of the hormone progesterone. They are given every 3 months by a health care provider. °Birth control pills °Birth control pills are pills that contain hormones that prevent pregnancy. They must be taken once a day, preferably at the  same time each day. A prescription is needed to use this method of contraception. °Birth control patch °The birth control patch contains hormones that prevent pregnancy. It is placed on the skin and must be changed once a week for three weeks and removed on the fourth week. A prescription is needed to use this method of contraception. °Vaginal ring °A vaginal ring contains hormones that prevent pregnancy. It is placed in the vagina for three weeks and removed on the fourth week. After that, the process is repeated with a new ring. A prescription is needed to use this method of contraception. °Emergency contraceptive °Emergency contraceptives prevent pregnancy after unprotected sex. They come in pill form and can be taken up to 5 days after sex. They work best the sooner they are taken after having sex. Most emergency contraceptives are available without a prescription. This method should not be used as your only form of birth control. °Barrier methods °Female condom °A female condom is a thin sheath that is worn over the penis during sex. Condoms keep sperm from going inside a woman's body. They can be used with a sperm-killing substance (spermicide) to increase their effectiveness. They should be thrown away after one use. °Female condom °A female condom is a soft, loose-fitting sheath that is put into the vagina before sex. The condom keeps sperm from going inside a woman's body. They should be thrown away after one use. °Diaphragm °A diaphragm is a soft, dome-shaped barrier. It is inserted into the vagina before sex, along with a spermicide. The diaphragm blocks sperm from entering the uterus, and the spermicide kills sperm. A diaphragm should be left in the vagina for 6-8 hours after sex and removed within 24 hours. °A diaphragm is prescribed and fitted by a health care provider. A diaphragm should be replaced every 1-2 years, after giving birth, after gaining more than 15 lb (6.8 kg), and after pelvic  surgery. °Cervical cap °A cervical cap is a round, soft latex or plastic cup that fits over the cervix. It is inserted into the vagina before sex, along with spermicide. It blocks sperm from entering the uterus. The cap should be left in place for 6-8 hours after sex and removed within 48 hours. A cervical cap must be prescribed and fitted by a health care provider. It should be replaced every 2 years. °Sponge °A sponge is a soft, circular piece of polyurethane foam with spermicide in it. The sponge helps block sperm from entering the uterus, and the spermicide kills sperm. To use it, you make it wet and then insert it into the vagina. It should be inserted before sex, left in for at least 6 hours after sex, and removed and thrown away within 30 hours. °Spermicides °Spermicides are chemicals that kill or block sperm from entering the cervix and uterus. They can come as a cream, jelly, suppository, foam, or tablet. A spermicide should be inserted into the vagina with an applicator at least 10-15 minutes before sex to allow time for it to work. The process must be repeated every time   you have sex. Spermicides do not require a prescription. °Intrauterine contraception °Intrauterine device (IUD) °An IUD is a T-shaped device that is put in a woman's uterus. There are two types: °Hormone IUD.This type contains progestin, a synthetic form of the hormone progesterone. This type can stay in place for 3-5 years. °Copper IUD.This type is wrapped in copper wire. It can stay in place for 10 years. °Permanent methods of contraception °Female tubal ligation °In this method, a woman's fallopian tubes are sealed, tied, or blocked during surgery to prevent eggs from traveling to the uterus. °Hysteroscopic sterilization °In this method, a small, flexible insert is placed into each fallopian tube. The inserts cause scar tissue to form in the fallopian tubes and block them, so sperm cannot reach an egg. The procedure takes about 3  months to be effective. Another form of birth control must be used during those 3 months. °Female sterilization °This is a procedure to tie off the tubes that carry sperm (vasectomy). After the procedure, the man can still ejaculate fluid (semen). Another form of birth control must be used for 3 months after the procedure. °Natural planning methods °Natural family planning °In this method, a couple does not have sex on days when the woman could become pregnant. °Calendar method °In this method, the woman keeps track of the length of each menstrual cycle, identifies the days when pregnancy can happen, and does not have sex on those days. °Ovulation method °In this method, a couple avoids sex during ovulation. °Symptothermal method °This method involves not having sex during ovulation. The woman typically checks for ovulation by watching changes in her temperature and in the consistency of cervical mucus. °Post-ovulation method °In this method, a couple waits to have sex until after ovulation. °Where to find more information °Centers for Disease Control and Prevention: www.cdc.gov °Summary °Contraception, also called birth control, refers to methods or devices that prevent pregnancy. °Hormonal methods of contraception include implants, injections, pills, patches, vaginal rings, and emergency contraceptives. °Barrier methods of contraception can include female condoms, female condoms, diaphragms, cervical caps, sponges, and spermicides. °There are two types of IUDs (intrauterine devices). An IUD can be put in a woman's uterus to prevent pregnancy for 3-5 years. °Permanent sterilization can be done through a procedure for males and females. Natural family planning methods involve nothaving sex on days when the woman could become pregnant. °This information is not intended to replace advice given to you by your health care provider. Make sure you discuss any questions you have with your health care provider. °Document  Revised: 08/08/2019 Document Reviewed: 08/08/2019 °Elsevier Patient Education © 2022 Elsevier Inc. ° °

## 2020-09-19 ENCOUNTER — Encounter: Payer: Self-pay | Admitting: Family Medicine

## 2020-09-19 DIAGNOSIS — O09899 Supervision of other high risk pregnancies, unspecified trimester: Secondary | ICD-10-CM | POA: Insufficient documentation

## 2020-09-19 DIAGNOSIS — Z3490 Encounter for supervision of normal pregnancy, unspecified, unspecified trimester: Secondary | ICD-10-CM

## 2020-09-19 HISTORY — DX: Encounter for supervision of normal pregnancy, unspecified, unspecified trimester: Z34.90

## 2020-09-19 LAB — HIV ANTIBODY (ROUTINE TESTING W REFLEX): HIV Screen 4th Generation wRfx: NONREACTIVE

## 2020-09-19 LAB — ABO AND RH: Rh Factor: POSITIVE

## 2020-09-19 LAB — RUBELLA ANTIBODY, IGM: Rubella IgM: <20 AU/mL (ref 0.0–19.9)

## 2020-09-19 LAB — RPR: RPR Ser Ql: NONREACTIVE

## 2020-09-19 LAB — PROTEIN / CREATININE RATIO, URINE
Creatinine, Urine: 284.6 mg/dL
Protein, Ur: 20.4 mg/dL
Protein/Creat Ratio: 72 mg/g creat (ref 0–200)

## 2020-09-19 LAB — HEPATITIS B SURFACE ANTIGEN: Hepatitis B Surface Ag: NEGATIVE

## 2020-09-19 LAB — HEPATITIS C ANTIBODY: Hep C Virus Ab: <0.1 s/co ratio (ref 0.0–0.9)

## 2020-10-02 ENCOUNTER — Inpatient Hospital Stay (HOSPITAL_COMMUNITY)
Admission: AD | Admit: 2020-10-02 | Discharge: 2020-10-02 | Disposition: A | Discharge status: Home / Self Care | Payer: Medicaid Other | Attending: Obstetrics & Gynecology | Admitting: Obstetrics & Gynecology

## 2020-10-02 ENCOUNTER — Other Ambulatory Visit: Payer: Self-pay

## 2020-10-02 DIAGNOSIS — Z7722 Contact with and (suspected) exposure to environmental tobacco smoke (acute) (chronic): Secondary | ICD-10-CM | POA: Insufficient documentation

## 2020-10-02 DIAGNOSIS — O368120 Decreased fetal movements, second trimester, not applicable or unspecified: Secondary | ICD-10-CM | POA: Insufficient documentation

## 2020-10-02 DIAGNOSIS — Z3A15 15 weeks gestation of pregnancy: Secondary | ICD-10-CM

## 2020-10-02 NOTE — MAU Provider Note (Signed)
History     CSN: [id number]  Arrival date and time: 10/02/20 1521   Event Date/Time   First Provider Initiated Contact with Patient 10/02/20 1557      Chief Complaint  Patient presents with   Decreased Fetal Movement   HPI Natalie Pacheco is a 20 y.o. G2P1001 at [redacted]w[redacted]d who presents with decreased fetal movement. Reports she started feeling movement at 10 weeks but hasn't felt anything since last Friday and was concerned. Reports occasional sharp pains on her sides but currently denies pain or vaginal bleeding.   OB History     Gravida  2   Para  1   Term  1   Preterm      AB      Living  1      SAB      IAB      Ectopic      Multiple  0   Live Births  1           Past Medical History:  Diagnosis Date   Abscess of buttock    ADHD (attention deficit hyperactivity disorder)    Asthma    DKA (diabetic ketoacidosis) (HCC) 06/16/2019   Gestational diabetes     Past Surgical History:  Procedure Laterality Date   CYST EXCISION N/A 07/17/2017   Procedure: EXCISION OF INFECTED GLUTEAL CYST;  Surgeon: Berna Bue, MD;  Location: [facility];  Service: General;  Laterality: N/A;   DENTAL SURGERY     INCISION AND DRAINAGE ABSCESS     Buttocks   TONSILLECTOMY      No family history on file.  Social History   Tobacco Use   Smoking status: Never    Passive exposure: Yes   Smokeless tobacco: Never   Tobacco comments:    Mother smokes outside of the home  Vaping Use   Vaping Use: Never used  Substance Use Topics   Alcohol use: No   Drug use: Never    Allergies: No Known Allergies  No medications prior to admission.    Review of Systems  Constitutional: Negative.   Gastrointestinal: Negative.   Genitourinary: Negative.   Physical Exam   Blood pressure (!) 115/55, temperature 98.6 F (37 C), temperature source Oral, resp. rate 17, last menstrual period 06/18/2020, SpO2 100 %, currently breastfeeding.  Physical  Exam Vitals and nursing note reviewed.  Constitutional:      General: She is not in acute distress.    Appearance: Normal appearance. She is not ill-appearing.  HENT:     Head: Normocephalic and atraumatic.  Eyes:     General: No scleral icterus. Pulmonary:     Effort: Pulmonary effort is normal. No respiratory distress.  Abdominal:     General: There is no distension.     Palpations: Abdomen is soft.     Tenderness: There is no abdominal tenderness.  Skin:    General: Skin is warm and dry.  Neurological:     Mental Status: She is alert.  Psychiatric:        Mood and Affect: Mood normal.        Behavior: Behavior normal.    MAU Course  Procedures  MDM Patient concerned due to no fetal movement since Friday. FHT present via doppler. Bedside ultrasound performed for patient reassurance. Patient states she feels better after seeing the baby move on the monitor. Discuss movement expectations at this gestational age & reasons to return to MAU. Description of abdominal  pain consistent with round ligament pain.   Pt informed that the ultrasound is considered a limited OB ultrasound and is not intended to be a complete ultrasound exam.  Patient also informed that the ultrasound is not being completed with the intent of assessing for fetal or placental anomalies or any pelvic abnormalities.  Explained that the purpose of today's ultrasound is to assess for patient reassurance.  Patient acknowledges the purpose of the exam and the limitations of the study.  Active IUP with FHR 164 bpm.    Assessment and Plan   1. Decreased fetal movements in second trimester, single or unspecified fetus   2. [redacted] weeks gestation of pregnancy    -reviewed reasons to return to MAU  Judeth Horn 10/02/2020, 5:36 PM

## 2020-10-02 NOTE — MAU Note (Signed)
.  Natalie Pacheco is a 20 y.o. at [redacted]w[redacted]d here in MAU reporting: she hasn't felt her baby move since Friday. She states that she has been able to feel the flutters before but has not been able to feel them anymore. States she knows that it is early but she was concerned because it is not moving. Denies VB or LOF. Denies pain currently.   Vitals:   10/02/20 1539  BP: (!) 124/59  Resp: 15  Temp: 98.6 F (37 C)  SpO2: 100%     FHT:175

## 2020-10-03 ENCOUNTER — Encounter: Payer: Self-pay | Admitting: *Deleted

## 2020-10-16 ENCOUNTER — Other Ambulatory Visit: Payer: Medicaid Other

## 2020-10-16 ENCOUNTER — Encounter: Payer: Self-pay | Admitting: Family Medicine

## 2020-10-16 ENCOUNTER — Ambulatory Visit (INDEPENDENT_AMBULATORY_CARE_PROVIDER_SITE_OTHER): Payer: Medicaid Other | Admitting: Family Medicine

## 2020-10-16 ENCOUNTER — Other Ambulatory Visit: Payer: Self-pay

## 2020-10-16 VITALS — BP 107/59 | HR 72 | Wt 188.5 lb

## 2020-10-16 DIAGNOSIS — Z2839 Other underimmunization status: Secondary | ICD-10-CM

## 2020-10-16 DIAGNOSIS — O09299 Supervision of pregnancy with other poor reproductive or obstetric history, unspecified trimester: Secondary | ICD-10-CM

## 2020-10-16 DIAGNOSIS — Z8759 Personal history of other complications of pregnancy, childbirth and the puerperium: Secondary | ICD-10-CM

## 2020-10-16 DIAGNOSIS — O0990 Supervision of high risk pregnancy, unspecified, unspecified trimester: Secondary | ICD-10-CM

## 2020-10-16 DIAGNOSIS — I471 Supraventricular tachycardia: Secondary | ICD-10-CM

## 2020-10-16 DIAGNOSIS — Z8632 Personal history of gestational diabetes: Secondary | ICD-10-CM

## 2020-10-16 MED ORDER — ACCU-CHEK SOFTCLIX LANCETS MISC: 12 refills | Status: DC

## 2020-10-16 MED ORDER — ACCU-CHEK GUIDE VI STRP: ORAL_STRIP | 12 refills | Status: DC

## 2020-10-16 MED ORDER — ACCU-CHEK GUIDE W/DEVICE KIT: 1.0000 | PACK | Freq: Four times a day (QID) | 0 refills | Status: DC

## 2020-10-16 NOTE — Progress Notes (Signed)
   Subjective:  Natalie Pacheco is a 20 y.o. G2P1001 at [redacted]w[redacted]d being seen today for ongoing prenatal care.  She is currently monitored for the following issues for this low-risk pregnancy and has Alpha thalassemia silent carrier; SVT (supraventricular tachycardia) ([place]); Supervision of high risk pregnancy, antepartum; History of gestational hypertension; History of gestational diabetes mellitus (GDM) in prior pregnancy, currently pregnant; ADHD, predominantly inattentive type; Mild intermittent asthma without complication; and Rubella non-immune status, antepartum on their problem list.  Patient reports no complaints.  Contractions: Not present. Vag. Bleeding: None.  Movement: Present. Denies leaking of fluid.   The following portions of the patient's history were reviewed and updated as appropriate: allergies, current medications, past family history, past medical history, past social history, past surgical history and problem list. Problem list updated.  Objective:   Vitals:   10/16/20 0913  BP: (!) 107/59  Pulse: 72  Weight: 188 lb 8 oz (85.5 kg)    Fetal Status: Fetal Heart Rate (bpm): 154   Movement: Present     General:  Alert, oriented and cooperative. Patient is in no acute distress.  Skin: Skin is warm and dry. No rash noted.   Cardiovascular: Normal heart rate noted  Respiratory: Normal respiratory effort, no problems with respiration noted  Abdomen: Soft, gravid, appropriate for gestational age. Pain/Pressure: Absent     Pelvic: Vag. Bleeding: None     Cervical exam deferred        Extremities: Normal range of motion.  Edema: None  Mental Status: Normal mood and affect. Normal behavior. Normal judgment and thought content.   Urinalysis:      Assessment and Plan:  Pregnancy: G2P1001 at [redacted]w[redacted]d  1. Supervision of high risk pregnancy, antepartum BP and FHR normal AFP today Antibody screen collected as well, not collected at new OB inadvertently - Antibody screen  2.  History of gestational hypertension BP normal, on ASA  3. History of gestational diabetes mellitus (GDM) in prior pregnancy, currently pregnant 2hr gtt today given hx in prior pregnancy of normal A1c at intake and DKA later in pregnancy, however patient vomited shortly after drinking it Given prior hx discussed we could reschedule GTT, or simply have her starting checking her sugars around 19-20 weeks She prefers the latter, will send supplies today and we will review log at next visit  4. Rubella non-immune status, antepartum Offer MMR PP  5. SVT (supraventricular tachycardia) (HCC) Noted during episode of DKA, no further workup per Cards at that time  Preterm labor symptoms and general obstetric precautions including but not limited to vaginal bleeding, contractions, leaking of fluid and fetal movement were reviewed in detail with the patient. Please refer to After Visit Summary for other counseling recommendations.  Return in 4 weeks (on 11/13/2020) for ob visit.   Clarnce Flock, MD

## 2020-10-16 NOTE — Patient Instructions (Signed)

## 2020-10-18 LAB — AFP, SERUM, OPEN SPINA BIFIDA
AFP MoM: 0.92
AFP Value: 31.9 ng/mL
Gest. Age on Collection Date: 17 weeks
Maternal Age At EDD: 20.9 yr
OSBR Risk 1 IN: 10000
Test Results:: NEGATIVE
Weight: 188 [lb_av]

## 2020-10-18 LAB — ANTIBODY SCREEN: Antibody Screen: NEGATIVE

## 2020-10-29 ENCOUNTER — Ambulatory Visit: Payer: Medicaid Other | Attending: Family Medicine

## 2020-10-29 ENCOUNTER — Other Ambulatory Visit: Payer: Self-pay

## 2020-10-29 ENCOUNTER — Other Ambulatory Visit: Payer: Medicaid Other

## 2020-10-29 ENCOUNTER — Ambulatory Visit (HOSPITAL_BASED_OUTPATIENT_CLINIC_OR_DEPARTMENT_OTHER): Payer: Medicaid Other | Admitting: Maternal & Fetal Medicine

## 2020-10-29 ENCOUNTER — Ambulatory Visit: Payer: Medicaid Other | Admitting: *Deleted

## 2020-10-29 ENCOUNTER — Other Ambulatory Visit: Payer: Self-pay | Admitting: *Deleted

## 2020-10-29 ENCOUNTER — Encounter: Payer: Self-pay | Admitting: *Deleted

## 2020-10-29 ENCOUNTER — Ambulatory Visit: Payer: Medicaid Other

## 2020-10-29 VITALS — BP 111/59 | HR 82

## 2020-10-29 DIAGNOSIS — Z369 Encounter for antenatal screening, unspecified: Secondary | ICD-10-CM

## 2020-10-29 DIAGNOSIS — O0990 Supervision of high risk pregnancy, unspecified, unspecified trimester: Secondary | ICD-10-CM | POA: Diagnosis present

## 2020-10-29 DIAGNOSIS — I471 Supraventricular tachycardia: Secondary | ICD-10-CM | POA: Insufficient documentation

## 2020-10-29 DIAGNOSIS — O09299 Supervision of pregnancy with other poor reproductive or obstetric history, unspecified trimester: Secondary | ICD-10-CM

## 2020-10-29 DIAGNOSIS — Z8632 Personal history of gestational diabetes: Secondary | ICD-10-CM | POA: Diagnosis present

## 2020-10-29 DIAGNOSIS — E139 Other specified diabetes mellitus without complications: Secondary | ICD-10-CM

## 2020-10-29 DIAGNOSIS — Z8759 Personal history of other complications of pregnancy, childbirth and the puerperium: Secondary | ICD-10-CM | POA: Diagnosis present

## 2020-10-29 DIAGNOSIS — O43199 Other malformation of placenta, unspecified trimester: Secondary | ICD-10-CM

## 2020-10-30 ENCOUNTER — Encounter: Payer: Self-pay | Admitting: Family Medicine

## 2020-10-30 DIAGNOSIS — O43199 Other malformation of placenta, unspecified trimester: Secondary | ICD-10-CM

## 2020-10-30 HISTORY — DX: Other malformation of placenta, unspecified trimester: O43.199

## 2020-10-30 LAB — HEMOGLOBIN A1C
Est. average glucose Bld gHb Est-mCnc: 103 mg/dL
Hgb A1c MFr Bld: 5.2 % (ref 4.8–5.6)

## 2020-10-30 NOTE — Progress Notes (Addendum)
MFM brief note  Ms. Natalie Pacheco 20 G2P1 at [employer] here with a single intrauterine pregnancy for a detailed anatomy due to suspected to type 2 diabetes  Normal anatomy with measurements consistent with dates There is good fetal movement and amniotic fluid volume Suboptimal views of the fetal anatomy were obtained secondary to fetal position.  Ms. Natalie Pacheco has had a past history of hbA1c of 9.5 and 7.8% of unknown etiology. In addition, she has been treated for diabetic ketoacidosis. She has not been screened this pregnancy.   I reviewed today's study and recommend serial growth, fetal echocardiogram and initiation of weekly testing at 32 weeks. We discussed the increased risk for fetal macrosomia, cardiac defects, maternal and fetal birth trauma with temporary and/or permenant damage, stillbirth and neonatal ICU admission.   We also observed a marginal cord insertion. We discussed that an MCI is associated with fetal growth delay, therefore serial growth assessments are recommended.  In addition we discussed starting daily low dose ASA for the prevention of preeclampsia.   I ordered a HbA1c today to screen for pregestational diabetes.   If normal recommend early screening if abnormal consider early testing.  Recommendation: Consider fetal echocardiogram referral sent to [facility] Follow up growth and anatomy in 4 weeks.

## 2020-11-12 ENCOUNTER — Encounter: Payer: Medicaid Other | Admitting: Obstetrics and Gynecology

## 2020-11-12 ENCOUNTER — Encounter: Payer: Self-pay | Admitting: Obstetrics and Gynecology

## 2020-11-12 NOTE — Progress Notes (Signed)
Patient did not keep her OB appointment for 11/12/2020.  Cornelia Copa MD Attending [facility] for [employer] [facility])

## 2020-11-20 ENCOUNTER — Telehealth: Payer: Self-pay

## 2020-11-20 NOTE — Telephone Encounter (Signed)
FETAL ECHO SCHEDULED FOR 11/29/2020@10A .

## 2020-11-21 ENCOUNTER — Ambulatory Visit (INDEPENDENT_AMBULATORY_CARE_PROVIDER_SITE_OTHER): Payer: Medicaid Other | Admitting: Nurse Practitioner

## 2020-11-21 ENCOUNTER — Other Ambulatory Visit: Payer: Self-pay

## 2020-11-21 VITALS — BP 108/63 | HR 105 | Wt 194.8 lb

## 2020-11-21 DIAGNOSIS — O09299 Supervision of pregnancy with other poor reproductive or obstetric history, unspecified trimester: Secondary | ICD-10-CM

## 2020-11-21 DIAGNOSIS — M545 Low back pain, unspecified: Secondary | ICD-10-CM

## 2020-11-21 DIAGNOSIS — G8929 Other chronic pain: Secondary | ICD-10-CM

## 2020-11-21 DIAGNOSIS — F439 Reaction to severe stress, unspecified: Secondary | ICD-10-CM

## 2020-11-21 DIAGNOSIS — O219 Vomiting of pregnancy, unspecified: Secondary | ICD-10-CM

## 2020-11-21 DIAGNOSIS — Z8632 Personal history of gestational diabetes: Secondary | ICD-10-CM

## 2020-11-21 DIAGNOSIS — Z8759 Personal history of other complications of pregnancy, childbirth and the puerperium: Secondary | ICD-10-CM

## 2020-11-21 DIAGNOSIS — Z23 Encounter for immunization: Secondary | ICD-10-CM

## 2020-11-21 DIAGNOSIS — O0990 Supervision of high risk pregnancy, unspecified, unspecified trimester: Secondary | ICD-10-CM | POA: Diagnosis not present

## 2020-11-21 DIAGNOSIS — Z3A24 24 weeks gestation of pregnancy: Secondary | ICD-10-CM

## 2020-11-21 NOTE — Progress Notes (Signed)
    Subjective:  Natalie Pacheco is a 20 y.o. G2P1001 at [redacted]w[redacted]d being seen today for ongoing prenatal care.  She is currently monitored for the following issues for this high-risk pregnancy and has Alpha thalassemia silent carrier; SVT (supraventricular tachycardia) (HCC); Supervision of high risk pregnancy, antepartum; History of gestational hypertension; History of gestational diabetes mellitus (GDM) in prior pregnancy, currently pregnant; ADHD, predominantly inattentive type; Mild intermittent asthma without complication; Rubella non-immune status, antepartum; and Marginal insertion of umbilical cord affecting management of mother on their problem list.  Patient reports backache.  Contractions: Not present. Vag. Bleeding: None.  Movement: Present. Denies leaking of fluid.   The following portions of the patient's history were reviewed and updated as appropriate: allergies, current medications, past family history, past medical history, past social history, past surgical history and problem list. Problem list updated.  Objective:   Vitals:   11/21/20 1556  BP: 108/63  Pulse: (!) 105  Weight: 194 lb 12.8 oz (88.4 kg)    Fetal Status: Fetal Heart Rate (bpm): 151 Fundal Height: 24 cm Movement: Present     General:  Alert, oriented and cooperative. Patient is in no acute distress.  Skin: Skin is warm and dry. No rash noted.   Cardiovascular: Normal heart rate noted  Respiratory: Normal respiratory effort, no problems with respiration noted  Abdomen: Soft, gravid, appropriate for gestational age. Pain/Pressure: Absent     Pelvic:  Cervical exam deferred        Extremities: Normal range of motion.  Edema: None  Mental Status: Normal mood and affect. Normal behavior. Normal judgment and thought content.   Urinalysis:      Assessment and Plan:  Pregnancy: G2P1001 at [redacted]w[redacted]d  1. Supervision of high risk pregnancy, antepartum Doing well except for midline low back pain  2. Situational  stress Did not specify but asked to be seen by [facility]  - Ambulatory referral to [facility]  3. History of gestational diabetes mellitus (GDM) in prior pregnancy, currently pregnant Reviewed previous notes about current treatment plan for anticipated gestational diabetes.  Patient did not pick up glucose monitoring supplies yet - will get them today.  Reviewed checking fasting and 2 hours after meals and recording readings - to bring to next visit.  4. History of gestational hypertension Normal BP today  5. Nausea and vomiting during pregnancy prior to [redacted] weeks gestation Still using medication for nausea No problems with constipation  6. Chronic midline low back pain without sciatica Given info on how to manage Discussed exercises she can try at home and gave handout in AVS  7. Asthma Has not used inhaler or nebulizer since last clinic visit.  Preterm labor symptoms and general obstetric precautions including but not limited to vaginal bleeding, contractions, leaking of fluid and fetal movement were reviewed in detail with the patient. Please refer to After Visit Summary for other counseling recommendations.  Return in about 3 weeks (around 12/12/2020) for [facility] - needs to see MD.  Nolene Bernheim, RN, MSN, NP-BC Nurse Practitioner, [facility], [facility] 11/21/2020 4:29 PM

## 2020-11-21 NOTE — Patient Instructions (Signed)
[email or web address] 

## 2020-11-26 ENCOUNTER — Other Ambulatory Visit: Payer: Self-pay

## 2020-11-26 ENCOUNTER — Ambulatory Visit: Payer: Medicaid Other | Admitting: *Deleted

## 2020-11-26 ENCOUNTER — Encounter: Payer: Self-pay | Admitting: *Deleted

## 2020-11-26 ENCOUNTER — Ambulatory Visit: Payer: Medicaid Other | Attending: Maternal & Fetal Medicine

## 2020-11-26 VITALS — BP 112/59 | HR 78

## 2020-11-26 DIAGNOSIS — Z8759 Personal history of other complications of pregnancy, childbirth and the puerperium: Secondary | ICD-10-CM

## 2020-11-26 DIAGNOSIS — O0990 Supervision of high risk pregnancy, unspecified, unspecified trimester: Secondary | ICD-10-CM | POA: Insufficient documentation

## 2020-11-26 DIAGNOSIS — Z8632 Personal history of gestational diabetes: Secondary | ICD-10-CM | POA: Diagnosis present

## 2020-11-26 DIAGNOSIS — O43192 Other malformation of placenta, second trimester: Secondary | ICD-10-CM

## 2020-11-26 DIAGNOSIS — Z3A23 23 weeks gestation of pregnancy: Secondary | ICD-10-CM

## 2020-11-26 DIAGNOSIS — O43199 Other malformation of placenta, unspecified trimester: Secondary | ICD-10-CM | POA: Diagnosis not present

## 2020-11-26 DIAGNOSIS — O09292 Supervision of pregnancy with other poor reproductive or obstetric history, second trimester: Secondary | ICD-10-CM | POA: Diagnosis not present

## 2020-11-26 DIAGNOSIS — O99212 Obesity complicating pregnancy, second trimester: Secondary | ICD-10-CM | POA: Diagnosis not present

## 2020-11-26 DIAGNOSIS — O09299 Supervision of pregnancy with other poor reproductive or obstetric history, unspecified trimester: Secondary | ICD-10-CM | POA: Diagnosis present

## 2020-11-26 DIAGNOSIS — E669 Obesity, unspecified: Secondary | ICD-10-CM

## 2020-11-27 ENCOUNTER — Other Ambulatory Visit: Payer: Self-pay | Admitting: *Deleted

## 2020-11-27 DIAGNOSIS — O43199 Other malformation of placenta, unspecified trimester: Secondary | ICD-10-CM

## 2020-11-27 DIAGNOSIS — Z6830 Body mass index (BMI) 30.0-30.9, adult: Secondary | ICD-10-CM

## 2020-11-27 DIAGNOSIS — O09299 Supervision of pregnancy with other poor reproductive or obstetric history, unspecified trimester: Secondary | ICD-10-CM

## 2020-12-12 ENCOUNTER — Ambulatory Visit (INDEPENDENT_AMBULATORY_CARE_PROVIDER_SITE_OTHER): Payer: Medicaid Other | Admitting: Obstetrics & Gynecology

## 2020-12-12 ENCOUNTER — Encounter: Payer: Self-pay | Admitting: Obstetrics & Gynecology

## 2020-12-12 ENCOUNTER — Other Ambulatory Visit: Payer: Self-pay

## 2020-12-12 VITALS — BP 116/68 | HR 89 | Wt 201.7 lb

## 2020-12-12 DIAGNOSIS — O0990 Supervision of high risk pregnancy, unspecified, unspecified trimester: Secondary | ICD-10-CM

## 2020-12-12 DIAGNOSIS — Z8632 Personal history of gestational diabetes: Secondary | ICD-10-CM

## 2020-12-12 DIAGNOSIS — O09299 Supervision of pregnancy with other poor reproductive or obstetric history, unspecified trimester: Secondary | ICD-10-CM

## 2020-12-12 DIAGNOSIS — O43199 Other malformation of placenta, unspecified trimester: Secondary | ICD-10-CM

## 2020-12-12 DIAGNOSIS — Z3A25 25 weeks gestation of pregnancy: Secondary | ICD-10-CM

## 2020-12-12 MED ORDER — ACCU-CHEK SOFTCLIX LANCETS MISC: 12 refills | Status: DC

## 2020-12-12 NOTE — Patient Instructions (Addendum)
Second Trimester of Pregnancy The second trimester of pregnancy is from week 13 through week 27. This is months 4 through 6 of pregnancy. The second trimester is often a time when you feel your best. Your body has adjusted to being pregnant, and you begin to feel better physically. During the second trimester: Morning sickness has lessened or stopped completely. You may have more energy. You may have an increase in appetite. The second trimester is also a time when the unborn baby (fetus) is growing rapidly. At the end of the sixth month, the fetus may be up to 12 inches long and weigh about 1 pounds. You will likely begin to feel the baby move (quickening) between 16 and 20 weeks of pregnancy. Body changes during your second trimester Your body continues to go through many changes during your second trimester. The changes vary and generally return to normal after the baby is born. Physical changes Your weight will continue to increase. You will notice your lower abdomen bulging out. You may begin to get stretch marks on your hips, abdomen, and breasts. Your breasts will continue to grow and to become tender. Dark spots or blotches (chloasma or mask of pregnancy) may develop on your face. A dark line from your belly button to the pubic area (linea nigra) may appear. You may have changes in your hair. These can include thickening of your hair, rapid growth, and changes in texture. Some people also have hair loss during or after pregnancy, or hair that feels dry or thin. Health changes You may develop headaches. You may have heartburn. You may develop constipation. You may develop hemorrhoids or swollen, bulging veins (varicose veins). Your gums may bleed and may be sensitive to brushing and flossing. You may urinate more often because the fetus is pressing on your bladder. You may have back pain. This is caused by: Weight gain. Pregnancy hormones that are relaxing the joints in your  pelvis. A shift in weight and the muscles that support your balance. Follow these instructions at home: Medicines Follow your health care provider's instructions regarding medicine use. Specific medicines may be either safe or unsafe to take during pregnancy. Do not take any medicines unless approved by your health care provider. Take a prenatal vitamin that contains at least 600 micrograms (mcg) of folic acid. Eating and drinking Eat a healthy diet that includes fresh fruits and vegetables, whole grains, good sources of protein such as meat, eggs, or tofu, and low-fat dairy products. Avoid raw meat and unpasteurized juice, milk, and cheese. These carry germs that can harm you and your baby. You may need to take these actions to prevent or treat constipation: Drink enough fluid to keep your urine pale yellow. Eat foods that are high in fiber, such as beans, whole grains, and fresh fruits and vegetables. Limit foods that are high in fat and processed sugars, such as fried or sweet foods. Activity Exercise only as directed by your health care provider. Most people can continue their usual exercise routine during pregnancy. Try to exercise for 30 minutes at least 5 days a week. Stop exercising if you develop contractions in your uterus. Stop exercising if you develop pain or cramping in the lower abdomen or lower back. Avoid exercising if it is very hot or humid or if you are at a high altitude. Avoid heavy lifting. If you choose to, you may have sex unless your health care provider tells you not to. Relieving pain and discomfort Wear a supportive bra  to prevent discomfort from breast tenderness. Take warm sitz baths to soothe any pain or discomfort caused by hemorrhoids. Use hemorrhoid cream if your health care provider approves. Rest with your legs raised (elevated) if you have leg cramps or low back pain. If you develop varicose veins: Wear support hose as told by your health care  provider. Elevate your feet for 15 minutes, 3-4 times a day. Limit salt in your diet. Safety Wear your seat belt at all times when driving or riding in a car. Talk with your health care provider if someone is verbally or physically abusive to you. Lifestyle Do not use hot tubs, steam rooms, or saunas. Do not douche. Do not use tampons or scented sanitary pads. Avoid cat litter boxes and soil used by cats. These carry germs that can cause birth defects in the baby and possibly loss of the fetus by miscarriage or stillbirth. Do not use herbal remedies, alcohol, illegal drugs, or medicines that are not approved by your health care provider. Chemicals in these products can harm your baby. Do not use any products that contain nicotine or tobacco, such as cigarettes, e-cigarettes, and chewing tobacco. If you need help quitting, ask your health care provider. General instructions During a routine prenatal visit, your health care provider will do a physical exam and other tests. He or she will also discuss your overall health. Keep all follow-up visits. This is important. Ask your health care provider for a referral to a local prenatal education class. Ask for help if you have counseling or nutritional needs during pregnancy. Your health care provider can offer advice or refer you to specialists for help with various needs. Where to find more information American Pregnancy Association: americanpregnancy.org [employer]: https://www.todd-brady.net/ Office on [employer] Health: [employer] Contact a health care provider if you have: A headache that does not go away when you take medicine. Vision changes or you see spots in front of your eyes. Mild pelvic cramps, pelvic pressure, or nagging pain in the abdominal area. Persistent nausea, vomiting, or diarrhea. A bad-smelling vaginal discharge or foul-smelling urine. Pain when you  urinate. Sudden or extreme swelling of your face, hands, ankles, feet, or legs. A fever. Get help right away if you: Have fluid leaking from your vagina. Have spotting or bleeding from your vagina. Have severe abdominal cramping or pain. Have difficulty breathing. Have chest pain. Have fainting spells. Have not felt your baby move for the time period told by your health care provider. Have new or increased pain, swelling, or redness in an arm or leg. Summary The second trimester of pregnancy is from week 13 through week 27 (months 4 through 6). Do not use herbal remedies, alcohol, illegal drugs, or medicines that are not approved by your health care provider. Chemicals in these products can harm your baby. Exercise only as directed by your health care provider. Most people can continue their usual exercise routine during pregnancy. Keep all follow-up visits. This is important. This information is not intended to replace advice given to you by your health care provider. Make sure you discuss any questions you have with your health care provider. Document Revised: 08/10/2019 Document Reviewed: 06/16/2019 Elsevier Patient Education  2022 [employer].   Deciding about Circumcision in Baby Boys  (The Basics)  What is circumcision?  Circumcision is a surgery that removes the skin that covers the tip of the penis, called the "foreskin" Circumcision is usually done when a boy is between 1  and 69 days old. In the [place], circumcision is common. In some other countries, fewer boys are circumcised. Circumcision is a common tradition in some religions.  Should I have my baby boy circumcised?  There is no easy answer. Circumcision has some benefits. But it also has risks. After talking with your doctor, you will have to decide for yourself what is right for your family.  What are the benefits of circumcision?  Circumcised boys seem to have slightly lower rates of: ?Urinary tract  infections ?Swelling of the opening at the tip of the penis Circumcised men seem to have slightly lower rates of: ?Urinary tract infections ?Swelling of the opening at the tip of the penis ?Penis cancer ?HIV and other infections that you catch during sex ?Cervical cancer in the women they have sex with Even so, in the [place], the risks of these problems are small - even in boys and men who have not been circumcised. Plus, boys and men who are not circumcised can reduce these extra risks by: ?Cleaning their penis well ?Using condoms during sex  What are the risks of circumcision? Risks include: ?Bleeding or infection from the surgery ?Damage to or amputation of the penis ?A chance that the doctor will cut off too much or not enough of the foreskin ?A chance that sex won't feel as good later in life Only about 1 out of every 200 circumcisions leads to problems. There is also a chance that your health insurance won't pay for circumcision.  How is circumcision done in baby boys? First, the baby gets medicine for pain relief. This might be a cream on the skin or a shot into the base of the penis. Next, the doctor cleans the baby's penis well. Then he or she uses special tools to cut off the foreskin. Finally, the doctor wraps a bandage (called gauze) around the baby's penis. If you have your baby circumcised, his doctor or nurse will give you instructions on how to care for him after the surgery. It is important that you follow those instructions carefully.  TDaP Vaccine Pregnancy Get the Whooping Cough Vaccine While You Are Pregnant (CDC)  It is important for women to get the whooping cough vaccine in the third trimester of each pregnancy. Vaccines are the best way to prevent this disease. There are 2 different whooping cough vaccines. Both vaccines combine protection against whooping cough, tetanus and diphtheria, but they are for different age groups: Tdap: for everyone 11 years or  older, including pregnant women  DTaP: for children 2 months through 46 years of age  You need the whooping cough vaccine during each of your pregnancies The recommended time to get the shot is during your 27th through 36th week of pregnancy, preferably during the earlier part of this time period. The Centers for Disease Control and Prevention (CDC) recommends that pregnant women receive the whooping cough vaccine for adolescents and adults (called Tdap vaccine) during the third trimester of each pregnancy. The recommended time to get the shot is during your 27th through 36th week of pregnancy, preferably during the earlier part of this time period. This replaces the original recommendation that pregnant women get the vaccine only if they had not previously received it. The [employer] and the [employer] support this recommendation.  You should get the whooping cough vaccine while pregnant to pass protection to your baby frame support disabled and/or not supported in this browser  Learn  why Vernona Rieger decided to get the whooping cough vaccine in her 3rd trimester of pregnancy and how her baby girl was born with some protection against the disease. Also available on YouTube. After receiving the whooping cough vaccine, your body will create protective antibodies (proteins produced by the body to fight off diseases) and pass some of them to your baby before birth. These antibodies provide your baby some short-term protection against whooping cough in early life. These antibodies can also protect your baby from some of the more serious complications that come along with whooping cough. Your protective antibodies are at their highest about 2 weeks after getting the vaccine, but it takes time to pass them to your baby. So the preferred time to get the whooping cough vaccine is early in your third trimester. The amount of whooping cough antibodies in  your body decreases over time. That is why CDC recommends you get a whooping cough vaccine during each pregnancy. Doing so allows each of your babies to get the greatest number of protective antibodies from you. This means each of your babies will get the best protection possible against this disease.  Getting the whooping cough vaccine while pregnant is better than getting the vaccine after you give birth Whooping cough vaccination during pregnancy is ideal so your baby will have short-term protection as soon as he is born. This early protection is important because your baby will not start getting his whooping cough vaccines until he is 2 months old. These first few months of life are when your baby is at greatest risk for catching whooping cough. This is also when he's at greatest risk for having severe, potentially life-threating complications from the infection. To avoid that gap in protection, it is best to get a whooping cough vaccine during pregnancy. You will then pass protection to your baby before he is born. To continue protecting your baby, he should get whooping cough vaccines starting at 2 months old. You may never have gotten the Tdap vaccine before and did not get it during this pregnancy. If so, you should make sure to get the vaccine immediately after you give birth, before leaving the hospital or birthing center. It will take about 2 weeks before your body develops protection (antibodies) in response to the vaccine. Once you have protection from the vaccine, you are less likely to give whooping cough to your newborn while caring for him. But remember, your baby will still be at risk for catching whooping cough from others. A recent study looked to see how effective Tdap was at preventing whooping cough in babies whose mothers got the vaccine while pregnant or in the hospital after giving birth. The study found that getting Tdap between 27 through 36 weeks of pregnancy is 85% more effective  at preventing whooping cough in babies younger than 2 months old. Blood tests cannot tell if you need a whooping cough vaccine There are no blood tests that can tell you if you have enough antibodies in your body to protect yourself or your baby against whooping cough. Even if you have been sick with whooping cough in the past or previously received the vaccine, you still should get the vaccine during each pregnancy. Breastfeeding may pass some protective antibodies onto your baby By breastfeeding, you may pass some antibodies you have made in response to the vaccine to your baby. When you get a whooping cough vaccine during your pregnancy, you will have antibodies in your breast milk that you can share  with your baby as soon as your milk comes in. However, your baby will not get protective antibodies immediately if you wait to get the whooping cough vaccine until after delivering your baby. This is because it takes about 2 weeks for your body to create antibodies. Learn more about the health benefits of breastfeeding.

## 2020-12-12 NOTE — Progress Notes (Signed)
PRENATAL VISIT NOTE  Subjective:  Natalie Pacheco is a 20 y.o. G2P1001 at [redacted]w[redacted]d being seen today for ongoing prenatal care.  She is currently monitored for the following issues for this high-risk pregnancy and has Alpha thalassemia silent carrier; SVT (supraventricular tachycardia) (HCC); Supervision of high risk pregnancy, antepartum; History of gestational hypertension; History of gestational diabetes mellitus (GDM) in prior pregnancy, currently pregnant; ADHD, predominantly inattentive type; Mild intermittent asthma without complication; Rubella non-immune status, antepartum; and Marginal insertion of umbilical cord affecting management of mother on their problem list.  Patient reports no complaints.  Contractions: Not present. Vag. Bleeding: None.  Movement: Present. Denies leaking of fluid.   The following portions of the patient's history were reviewed and updated as appropriate: allergies, current medications, past family history, past medical history, past social history, past surgical history and problem list.   Objective:   Vitals:   12/12/20 1531  BP: 116/68  Pulse: 89  Weight: 201 lb 11.2 oz (91.5 kg)    Fetal Status: Fetal Heart Rate (bpm): 148 Fundal Height: 26 cm Movement: Present     General:  Alert, oriented and cooperative. Patient is in no acute distress.  Skin: Skin is warm and dry. No rash noted.   Cardiovascular: Normal heart rate noted  Respiratory: Normal respiratory effort, no problems with respiration noted  Abdomen: Soft, gravid, appropriate for gestational age.  Pain/Pressure: Present     Pelvic: Cervical exam deferred        Extremities: Normal range of motion.  Edema: None  Mental Status: Normal mood and affect. Normal behavior. Normal judgment and thought content.   Imaging: [place] MFM OB FOLLOW UP  Result Date: 11/26/2020 ----------------------------------------------------------------------  OBSTETRICS REPORT                       (Signed Final  11/26/2020 04:37 pm) ---------------------------------------------------------------------- Patient Info  ID #:       [id number]                          D.O.B.:  17-Jul-2000 (20 yrs)  Name:       Natalie Pacheco              Visit Date: 11/26/2020 03:42 pm ---------------------------------------------------------------------- Performed By  Attending:        Lin Landsman      Ref. Address:     [address]                    [place]                                                             [place], [place]                                                             [address]  Performed By:     Fayne Norrie BS,      [facility]  Referred By:      [facility] ---------------------------------------------------------------------- Orders  #  Description                           Code        Ordered By  1  [place] MFM OB FOLLOW UP                   [phone]    Lin Landsman ----------------------------------------------------------------------  #  Order #                     Accession #                Episode #  1  [id number]                   [id number]                 [id number] ---------------------------------------------------------------------- Indications  Poor obstetric history: Previous gestational   O09.299  diabetes/pre e (DKA w/ 1st preg)  Obesity complicating pregnancy, second         O99.212  trimester (pregravid BMI 32)  Antenatal follow-up for nonvisualized fetal    Z36.2  anatomy  Low risk NIPS/neg AFP  Genetic carrier (alpha thal)                   Z14.8  Medical complication of pregnancy (SVT)        O26.90  Marginal insertion of umbilical cord affecting O43.192  management of mother in  second trimester  [redacted] weeks gestation of pregnancy                Z3A.23 ---------------------------------------------------------------------- Fetal Evaluation  Num Of Fetuses:         1  Fetal Heart Rate(bpm):  155  Cardiac Activity:       Observed  Presentation:           Cephalic  Placenta:               Posterior Fundal  P. Cord Insertion:  Marginal insertion  Amniotic Fluid  AFI FV:      Within normal limits                              Largest Pocket(cm)                              5.97 ---------------------------------------------------------------------- Biometry  BPD:      54.1  mm     G. Age:  22w 3d         25  %    CI:        73.06   %    70 - 86                                                          FL/HC:      18.0   %    19.2 - 20.8  HC:      201.2  mm     G. Age:  22w 2d         12  %    HC/AC:      1.10        1.05 - 1.21  AC:      182.8  mm     G. Age:  23w 1d         45  %    FL/BPD:     67.1   %    71 - 87  FL:       36.3  mm     G. Age:  21w 4d          5  %    FL/AC:      19.9   %    20 - 24  LV:          4  mm  Est. FW:     500  gm      1 lb 2 oz     17  % ---------------------------------------------------------------------- OB History  Gravidity:    2         Term:   1        Prem:   0        SAB:   0  TOP:          0       Ectopic:  0        Living: 1 ---------------------------------------------------------------------- Gestational Age  LMP:           23w 0d        Date:  06/18/20                 EDD:   03/25/21  U/S Today:     22w 3d                                        EDD:   03/29/21  Best:          23w 0d     Det. By:  LMP  (06/18/20)          EDD:   03/25/21 ----------------------------------------------------------------------  Anatomy  Cranium:               Appears normal         LVOT:                   Appears normal  Cavum:                 Appears normal         Aortic Arch:            Previously seen  Ventricles:            Appears normal         Ductal Arch:             Appears normal  Choroid Plexus:        Previously seen        Diaphragm:              Appears normal  Cerebellum:            Previously seen        Stomach:                Appears normal, left                                                                        sided  Posterior Fossa:       Previously seen        Abdomen:                Appears normal  Nuchal Fold:           Previously seen        Abdominal Wall:         Appears nml (cord                                                                        insert, abd wall)  Face:                  Orbits and profile     Cord Vessels:           Appears normal (3                         previously seen                                vessel cord)  Lips:                  Previously seen        Kidneys:                Appear normal  Palate:                Not well visualized    Bladder:  Appears normal  Thoracic:              Appears normal         Spine:                  Previously seen  Heart:                 Appears normal         Upper Extremities:      Previously seen                         (4CH, axis, and                         situs)  RVOT:                  Appears normal         Lower Extremities:      Previously seen ---------------------------------------------------------------------- Targeted Anatomy  Central Nervous System  Cereb./Vermis:         Seen on prior scan  Head/Neck  Nasal Bone:            Present                Orbits/Eyes:            Seen on prior scan  Thorax  SVC:                   Prev seen              3 V Trachea View:       Prev seen  3 Vessel View:         Prev seen              IVC:                    Prev seen  Extremities  Lt Humerus:            Seen on prior scan     Lt Femur:               Appears normal  Rt Humerus:            Seen on prior scan     Rt Femur:               Appears normal  Lt Forearm:            Seen on prior scan     Lt Lower Leg:           Appears normal  Rt Forearm:            Seen on prior scan      Rt Lower Leg:           Appears normal  Lt Hand:               Seen on prior scan     Lt Foot:                Seen on prior scan  Rt Hand:               Seen on prior scan     Rt Foot:                Seen on prior scan ---------------------------------------------------------------------- Cervix Uterus Adnexa  Cervix  Length:  4.14  cm.  Normal appearance by transabdominal scan. ---------------------------------------------------------------------- Impression  Follow up growth due history of GDM, elevated BMI and  marginal cord insertion  Normal interval growth with measurements consistent with  dates  Good fetal movement and amniotic fluid volume ---------------------------------------------------------------------- Recommendations  Follow up growth in 4-6 weeks ----------------------------------------------------------------------               Lin Landsman, MD Electronically Signed Final Report   11/26/2020 04:37 pm ----------------------------------------------------------------------   Assessment and Plan:  Pregnancy: G2P1001 at [redacted]w[redacted]d 1. Marginal insertion of umbilical cord affecting management of mother Follow up scans as per MFM  2. History of gestational diabetes mellitus (GDM) in prior pregnancy, currently pregnant Could not do early GTT due to emesis, HgA1C on 10/29/20 was 5.2. She was given the option to repeat GTT, she declined and wanted to check blood sugars instead. She has yet to start this given that she had issues getting strips and needs lancets to be re-prescribed. Re-offered  GTT, she is concerned about having emesis again. She feels this may be able to be done if she can eat prior to the test.  She was given the option to have 1 hr GTT instead; this is not a fasting test and she was encouraged to have a high protein breakfast, and do 1 hr GTT. She understands that she will need confirmatory 2 hr or 3 hr GTT if she has abnormal 1 hr GTT. - Accu-Chek Softclix Lancets  lancets; Use as instructed  Dispense: 100 each; Refill: 12  3. [redacted] weeks gestation of pregnancy 4. Supervision of high risk pregnancy, antepartum Preterm labor symptoms and general obstetric precautions including but not limited to vaginal bleeding, contractions, leaking of fluid and fetal movement were reviewed in detail with the patient. Please refer to After Visit Summary for other counseling recommendations.   Return in about 3 weeks (around 01/02/2021) for 1 hr GTT (nonfasting test), 3rd trimester labs, TDap, OFFICE OB VISIT (MD only).  Future Appointments  Date Time Provider Department Center  01/02/2021  8:35 AM Venora Maples, MD [facility]  01/02/2021  9:30 AM [facility]  01/07/2021  3:15 PM [facility] NURSE [facility]  01/07/2021  3:30 PM [facility] US3 [facility]    Jaynie Collins, MD

## 2021-01-02 ENCOUNTER — Other Ambulatory Visit: Payer: Self-pay

## 2021-01-02 ENCOUNTER — Other Ambulatory Visit: Payer: Medicaid Other

## 2021-01-02 ENCOUNTER — Ambulatory Visit (INDEPENDENT_AMBULATORY_CARE_PROVIDER_SITE_OTHER): Payer: Medicaid Other | Admitting: Family Medicine

## 2021-01-02 ENCOUNTER — Encounter: Payer: Self-pay | Admitting: Family Medicine

## 2021-01-02 VITALS — BP 119/74 | HR 80 | Wt 206.4 lb

## 2021-01-02 DIAGNOSIS — Z23 Encounter for immunization: Secondary | ICD-10-CM | POA: Diagnosis not present

## 2021-01-02 DIAGNOSIS — Z8632 Personal history of gestational diabetes: Secondary | ICD-10-CM

## 2021-01-02 DIAGNOSIS — Z2839 Other underimmunization status: Secondary | ICD-10-CM

## 2021-01-02 DIAGNOSIS — O09899 Supervision of other high risk pregnancies, unspecified trimester: Secondary | ICD-10-CM

## 2021-01-02 DIAGNOSIS — Z8759 Personal history of other complications of pregnancy, childbirth and the puerperium: Secondary | ICD-10-CM

## 2021-01-02 DIAGNOSIS — O0990 Supervision of high risk pregnancy, unspecified, unspecified trimester: Secondary | ICD-10-CM

## 2021-01-02 DIAGNOSIS — O09299 Supervision of pregnancy with other poor reproductive or obstetric history, unspecified trimester: Secondary | ICD-10-CM

## 2021-01-02 DIAGNOSIS — O43199 Other malformation of placenta, unspecified trimester: Secondary | ICD-10-CM

## 2021-01-02 NOTE — Progress Notes (Signed)
   Subjective:  Natalie Pacheco is a 20 y.o. G2P1001 at [redacted]w[redacted]d being seen today for ongoing prenatal care.  She is currently monitored for the following issues for this high-risk pregnancy and has Alpha thalassemia silent carrier; SVT (supraventricular tachycardia) ([place]); Supervision of high risk pregnancy, antepartum; History of gestational hypertension; History of gestational diabetes mellitus (GDM) in prior pregnancy, currently pregnant; ADHD, predominantly inattentive type; Mild intermittent asthma without complication; Rubella non-immune status, antepartum; and Marginal insertion of umbilical cord affecting management of mother on their problem list.  Patient reports no complaints.  Contractions: Not present. Vag. Bleeding: None.  Movement: Present. Denies leaking of fluid.   The following portions of the patient's history were reviewed and updated as appropriate: allergies, current medications, past family history, past medical history, past social history, past surgical history and problem list. Problem list updated.  Objective:   Vitals:   01/02/21 0843  BP: 119/74  Pulse: 80  Weight: 206 lb 6.4 oz (93.6 kg)    Fetal Status: Fetal Heart Rate (bpm): 145   Movement: Present     General:  Alert, oriented and cooperative. Patient is in no acute distress.  Skin: Skin is warm and dry. No rash noted.   Cardiovascular: Normal heart rate noted  Respiratory: Normal respiratory effort, no problems with respiration noted  Abdomen: Soft, gravid, appropriate for gestational age. Pain/Pressure: Absent     Pelvic: Vag. Bleeding: None     Cervical exam deferred        Extremities: Normal range of motion.  Edema: None  Mental Status: Normal mood and affect. Normal behavior. Normal judgment and thought content.   Urinalysis:      Assessment and Plan:  Pregnancy: G2P1001 at [redacted]w[redacted]d  1. Supervision of high risk pregnancy, antepartum BP and FHR normal 28 wk labs today, including 1hr and  A1c Accepts TDaP today - CBC - RPR - HIV antibody (with reflex) - Antibody screen  2. History of gestational diabetes mellitus (GDM) in prior pregnancy, currently pregnant Previously has had DKA in last pregnancy A1c 5.2% at new OB Has declined early GTT in the past due to n/v Had also been offered to check sugars but has not been successful, worried machine might be broken Will have her come to review machine with RN to troubleshoot Fetal echo done 12/11/20, normal Has growth scan scheduled for next week MFM recommend to start antenatal testing at 32 weeks - HgB A1c  3. History of gestational hypertension On ASA  4. Marginal insertion of umbilical cord affecting management of mother Following w MFM, 20% at anatomy scan Growth scheduled for next week  5. Rubella non-immune status, antepartum Offer MMR PP  Preterm labor symptoms and general obstetric precautions including but not limited to vaginal bleeding, contractions, leaking of fluid and fetal movement were reviewed in detail with the patient. Please refer to After Visit Summary for other counseling recommendations.  Return in 2 weeks (on 01/16/2021) for [facility], ob visit.   Clarnce Flock, MD

## 2021-01-02 NOTE — Patient Instructions (Signed)

## 2021-01-03 ENCOUNTER — Ambulatory Visit (INDEPENDENT_AMBULATORY_CARE_PROVIDER_SITE_OTHER): Payer: Medicaid Other

## 2021-01-03 ENCOUNTER — Encounter: Payer: Self-pay | Admitting: Registered"

## 2021-01-03 VITALS — BP 115/53 | HR 84 | Wt 205.6 lb

## 2021-01-03 DIAGNOSIS — O09299 Supervision of pregnancy with other poor reproductive or obstetric history, unspecified trimester: Secondary | ICD-10-CM

## 2021-01-03 DIAGNOSIS — O0990 Supervision of high risk pregnancy, unspecified, unspecified trimester: Secondary | ICD-10-CM

## 2021-01-03 DIAGNOSIS — Z7189 Other specified counseling: Secondary | ICD-10-CM

## 2021-01-03 DIAGNOSIS — Z8632 Personal history of gestational diabetes: Secondary | ICD-10-CM

## 2021-01-03 LAB — GLUCOSE TOLERANCE, 1 HOUR: Glucose, 1Hr PP: 146 mg/dL (ref 70–199)

## 2021-01-03 LAB — CBC
Hematocrit: 39.7 % (ref 34.0–46.6)
Hemoglobin: 13.1 g/dL (ref 11.1–15.9)
MCH: 23.9 pg — ABNORMAL LOW (ref 26.6–33.0)
MCHC: 33 g/dL (ref 31.5–35.7)
MCV: 73 fL — ABNORMAL LOW (ref 79–97)
Platelets: 338 10*3/uL (ref 150–450)
RBC: 5.47 x10E6/uL — ABNORMAL HIGH (ref 3.77–5.28)
RDW: 14.6 % (ref 11.7–15.4)
WBC: 19.8 10*3/uL — ABNORMAL HIGH (ref 3.4–10.8)

## 2021-01-03 LAB — RPR: RPR Ser Ql: NONREACTIVE

## 2021-01-03 LAB — HIV ANTIBODY (ROUTINE TESTING W REFLEX): HIV Screen 4th Generation wRfx: NONREACTIVE

## 2021-01-03 LAB — HEMOGLOBIN A1C
Est. average glucose Bld gHb Est-mCnc: 108 mg/dL
Hgb A1c MFr Bld: 5.4 % (ref 4.8–5.6)

## 2021-01-03 LAB — ANTIBODY SCREEN: Antibody Screen: NEGATIVE

## 2021-01-03 NOTE — Progress Notes (Signed)
Chart reviewed for nurse visit. Agree with plan of care.   Abnormal 1 hr GTT and has not tolerated 3hr GTT in the past. Although A1c normal, given prior hx of DKA in pregnancy and this abnormal test more prudent to treat as GDM and have her check QID.   Venora Maples, MD 01/03/21 8:13 PM

## 2021-01-03 NOTE — Progress Notes (Signed)
Here today for assistance with blood glucose meter. Pt has past history of gestational diabetes. 1 HR glucose test from 01/02/21 was 146. Pt agrees to check BG 4 times daily instead of completing a 3 HR glucose test. Strips do not match meter. Marylene Land, RD to bedside to give patient accu chek meter and sample of strips and lancets. Additional supplies at pharmacy for pick up.   Pt reports ongoing irritated throat and discomfort in stomach that she describes as "like a scratch." Reports blood tinged mucous in vomit this AM. Reviewed this would be cause for concern if bright red blood or coffee ground emesis. However does not sound concerning at this time. Pt may bring up at routine OB appt if continues to be a concern.  Patient reports she fell down the stairs this morning at 1:45 AM. Place of impact was right hip. Pt states she did not hit her stomach. Has felt good fetal movement since the fall. Denies any abdominal pain or vaginal bleeding. Return precautions given. Reviewed with Crissie Reese, MD who states pt may continue to monitor at home and return for [place] with MFM on 01/07/21.  Fleet Contras RN 01/03/21

## 2021-01-03 NOTE — Progress Notes (Signed)
Patient was in nurse visit with Louisa Second, RN and had questions about checking her blood sugar. RD went into nurse visit room and answered questions about testing supplies. Patient needs Accu chek meter to start testing blood sugar.   RD provided sample kit: Accu-chek Guide Me Lot #[id number] Exp: 01/23/2022

## 2021-01-07 ENCOUNTER — Other Ambulatory Visit: Payer: Self-pay

## 2021-01-07 ENCOUNTER — Ambulatory Visit: Payer: Medicaid Other | Admitting: *Deleted

## 2021-01-07 ENCOUNTER — Ambulatory Visit: Payer: Medicaid Other | Attending: Maternal & Fetal Medicine

## 2021-01-07 ENCOUNTER — Encounter: Payer: Self-pay | Admitting: *Deleted

## 2021-01-07 VITALS — BP 123/53 | HR 95

## 2021-01-07 DIAGNOSIS — Z8759 Personal history of other complications of pregnancy, childbirth and the puerperium: Secondary | ICD-10-CM

## 2021-01-07 DIAGNOSIS — O09299 Supervision of pregnancy with other poor reproductive or obstetric history, unspecified trimester: Secondary | ICD-10-CM | POA: Diagnosis present

## 2021-01-07 DIAGNOSIS — O09293 Supervision of pregnancy with other poor reproductive or obstetric history, third trimester: Secondary | ICD-10-CM

## 2021-01-07 DIAGNOSIS — Z8632 Personal history of gestational diabetes: Secondary | ICD-10-CM | POA: Diagnosis present

## 2021-01-07 DIAGNOSIS — Z6830 Body mass index (BMI) 30.0-30.9, adult: Secondary | ICD-10-CM | POA: Insufficient documentation

## 2021-01-07 DIAGNOSIS — O0990 Supervision of high risk pregnancy, unspecified, unspecified trimester: Secondary | ICD-10-CM

## 2021-01-07 DIAGNOSIS — O43193 Other malformation of placenta, third trimester: Secondary | ICD-10-CM | POA: Diagnosis not present

## 2021-01-07 DIAGNOSIS — O43199 Other malformation of placenta, unspecified trimester: Secondary | ICD-10-CM | POA: Insufficient documentation

## 2021-01-07 DIAGNOSIS — Z3A29 29 weeks gestation of pregnancy: Secondary | ICD-10-CM

## 2021-01-08 ENCOUNTER — Other Ambulatory Visit: Payer: Self-pay | Admitting: *Deleted

## 2021-01-08 DIAGNOSIS — O24419 Gestational diabetes mellitus in pregnancy, unspecified control: Secondary | ICD-10-CM

## 2021-01-11 NOTE — Telephone Encounter (Signed)
[facility] intern left HIPPA-compliant message to call back Asher Muir from [employer] for [employer] at [facility] at  [phone] [facility] office).

## 2021-01-18 ENCOUNTER — Encounter: Payer: Medicaid Other | Admitting: Obstetrics & Gynecology

## 2021-01-30 ENCOUNTER — Telehealth: Payer: Self-pay

## 2021-01-30 NOTE — Telephone Encounter (Signed)
Pt scheduled for [facility] BEHAVIORAL HEALTH CLINICIAN on 02/19/2021 at 1:15pm   Sent letter

## 2021-02-04 ENCOUNTER — Other Ambulatory Visit: Payer: Self-pay

## 2021-02-04 ENCOUNTER — Ambulatory Visit: Payer: Medicaid Other | Attending: Obstetrics

## 2021-02-04 ENCOUNTER — Other Ambulatory Visit: Payer: Self-pay | Admitting: Obstetrics

## 2021-02-04 ENCOUNTER — Other Ambulatory Visit: Payer: Self-pay | Admitting: *Deleted

## 2021-02-04 ENCOUNTER — Encounter: Payer: Self-pay | Admitting: *Deleted

## 2021-02-04 ENCOUNTER — Ambulatory Visit: Payer: Medicaid Other | Admitting: *Deleted

## 2021-02-04 VITALS — BP 118/58 | HR 95

## 2021-02-04 DIAGNOSIS — O24419 Gestational diabetes mellitus in pregnancy, unspecified control: Secondary | ICD-10-CM | POA: Diagnosis not present

## 2021-02-04 DIAGNOSIS — O99213 Obesity complicating pregnancy, third trimester: Secondary | ICD-10-CM | POA: Diagnosis not present

## 2021-02-04 DIAGNOSIS — O09299 Supervision of pregnancy with other poor reproductive or obstetric history, unspecified trimester: Secondary | ICD-10-CM

## 2021-02-04 DIAGNOSIS — E669 Obesity, unspecified: Secondary | ICD-10-CM

## 2021-02-04 DIAGNOSIS — O09293 Supervision of pregnancy with other poor reproductive or obstetric history, third trimester: Secondary | ICD-10-CM | POA: Diagnosis not present

## 2021-02-04 DIAGNOSIS — O0990 Supervision of high risk pregnancy, unspecified, unspecified trimester: Secondary | ICD-10-CM | POA: Diagnosis present

## 2021-02-04 DIAGNOSIS — O43193 Other malformation of placenta, third trimester: Secondary | ICD-10-CM | POA: Diagnosis not present

## 2021-02-04 DIAGNOSIS — O24410 Gestational diabetes mellitus in pregnancy, diet controlled: Secondary | ICD-10-CM

## 2021-02-04 DIAGNOSIS — Z8759 Personal history of other complications of pregnancy, childbirth and the puerperium: Secondary | ICD-10-CM | POA: Diagnosis present

## 2021-02-04 DIAGNOSIS — Z8632 Personal history of gestational diabetes: Secondary | ICD-10-CM | POA: Insufficient documentation

## 2021-02-04 DIAGNOSIS — Z3A33 33 weeks gestation of pregnancy: Secondary | ICD-10-CM

## 2021-02-05 NOTE — BH Specialist Note (Signed)
Integrated Behavioral Health via Telemedicine Visit  02/05/2021 Natalie Pacheco [id number]  Number of Integrated Behavioral Health visits: 1 Session Start time: 1:22  Session End time: 1:42 Total time: 20  Referring Provider: Orson Slick, NP Patient/Family location: [facility] Provider location: [facility]  All persons participating in visit: Patient Natalie Pacheco and Natalie Pacheco   Types of Service: Individual psychotherapy and Video visit  I connected with Natalie Pacheco and/or Natalie Pacheco's  n/a  via  Telephone or Engineer, civil (consulting)  (Video is Caregility application) and verified that I am speaking with the correct person using two identifiers. Discussed confidentiality: Yes   I discussed the limitations of telemedicine and the availability of in person appointments.  Discussed there is a possibility of technology failure and discussed alternative modes of communication if that failure occurs.  I discussed that engaging in this telemedicine visit, they consent to the provision of behavioral healthcare and the services will be billed under their insurance.  Patient and/or legal guardian expressed understanding and consented to Telemedicine visit: Yes   Presenting Concerns: Patient and/or family reports the following symptoms/concerns: Recent life stress/feeling lack of expected support; sleeping and eating well, mom and FOB supportive.  Duration of problem: Current pregnancy; Severity of problem: mild  Patient and/or Family's Strengths/Protective Factors: Concrete supports in place (healthy food, safe environments, etc.) and Sense of purpose  Goals Addressed: Patient will:  Reduce symptoms of: stress   Increase knowledge and/or ability of: stress reduction   Demonstrate ability to: Increase healthy adjustment to current life circumstances and Increase adequate support systems  for patient/family  Progress towards Goals: Ongoing  Interventions: Interventions utilized:  Psychoeducation and/or Health Education, Link to [employer], and Supportive Reflection Standardized Assessments completed:  PHQ9/GAD7 given in past two weeks  Patient and/or Family Response: Pt agrees with treatment plan  Assessment: Patient currently experiencing Other specified counseling  Patient may benefit from psychoeducation and brief therapeutic interventions regarding coping with current life stress .  Plan: Follow up with behavioral health clinician on : two weeks postpartum; Call Natalie Pacheco at [phone], as needed. Behavioral recommendations:  -Continue prioritizing healthy self-care via prenatal vitamin, healthy sleeping and eating -Read Postpartum Planner on After Visit Summary Referral(s): Integrated [employer] (In Clinic)  I discussed the assessment and treatment plan with the patient and/or parent/guardian. They were provided an opportunity to ask questions and all were answered. They agreed with the plan and demonstrated an understanding of the instructions.   They were advised to call back or seek an in-person evaluation if the symptoms worsen or if the condition fails to improve as anticipated.  Rae Lips, LCSW  Depression screen [facility] 2/9 02/18/2021 01/02/2021 12/13/2020 11/21/2020 09/18/2020  Decreased Interest 0 0 0 0 0  Down, Depressed, Hopeless 0 0 0 0 0  PHQ - 2 Score 0 0 0 0 0  Altered sleeping 0 0 0 0 0  Tired, decreased energy 0 0 0 0 0  Change in appetite 0 0 0 0 1  Feeling bad or failure about yourself  0 0 0 0 0  Trouble concentrating 0 0 0 0 0  Moving slowly or fidgety/restless 0 0 0 0 0  Suicidal thoughts 0 0 0 0 0  PHQ-9 Score 0 0 0 0 1  Difficult doing work/chores - - - Not difficult at all -  Some recent data might be hidden   GAD  7 : Generalized Anxiety Score 02/18/2021 01/02/2021 12/13/2020 11/21/2020  Nervous, Anxious, on  Edge 0 0 0 0  Control/stop worrying 0 0 0 0  Worry too much - different things 0 0 0 0  Trouble relaxing 0 0 0 0  Restless 0 0 0 0  Easily annoyed or irritable 0 0 0 0  Afraid - awful might happen 0 0 0 0  Total GAD 7 Score 0 0 0 0  Anxiety Difficulty - - - -

## 2021-02-13 ENCOUNTER — Ambulatory Visit: Payer: Medicaid Other | Admitting: *Deleted

## 2021-02-13 ENCOUNTER — Encounter: Payer: Self-pay | Admitting: *Deleted

## 2021-02-13 ENCOUNTER — Ambulatory Visit: Payer: Medicaid Other | Attending: Obstetrics and Gynecology

## 2021-02-13 ENCOUNTER — Other Ambulatory Visit: Payer: Self-pay

## 2021-02-13 VITALS — BP 103/56 | HR 104

## 2021-02-13 DIAGNOSIS — O0990 Supervision of high risk pregnancy, unspecified, unspecified trimester: Secondary | ICD-10-CM | POA: Insufficient documentation

## 2021-02-13 DIAGNOSIS — E669 Obesity, unspecified: Secondary | ICD-10-CM | POA: Diagnosis not present

## 2021-02-13 DIAGNOSIS — O99213 Obesity complicating pregnancy, third trimester: Secondary | ICD-10-CM | POA: Diagnosis not present

## 2021-02-13 DIAGNOSIS — O09299 Supervision of pregnancy with other poor reproductive or obstetric history, unspecified trimester: Secondary | ICD-10-CM | POA: Insufficient documentation

## 2021-02-13 DIAGNOSIS — O09293 Supervision of pregnancy with other poor reproductive or obstetric history, third trimester: Secondary | ICD-10-CM | POA: Diagnosis not present

## 2021-02-13 DIAGNOSIS — Z3A34 34 weeks gestation of pregnancy: Secondary | ICD-10-CM

## 2021-02-13 DIAGNOSIS — Z8632 Personal history of gestational diabetes: Secondary | ICD-10-CM | POA: Diagnosis present

## 2021-02-13 DIAGNOSIS — O24410 Gestational diabetes mellitus in pregnancy, diet controlled: Secondary | ICD-10-CM | POA: Insufficient documentation

## 2021-02-13 DIAGNOSIS — Z8759 Personal history of other complications of pregnancy, childbirth and the puerperium: Secondary | ICD-10-CM

## 2021-02-18 ENCOUNTER — Other Ambulatory Visit: Payer: Self-pay

## 2021-02-18 ENCOUNTER — Ambulatory Visit: Payer: Medicaid Other | Admitting: *Deleted

## 2021-02-18 ENCOUNTER — Ambulatory Visit (INDEPENDENT_AMBULATORY_CARE_PROVIDER_SITE_OTHER): Payer: Medicaid Other

## 2021-02-18 VITALS — BP 117/77 | HR 101 | Wt 214.4 lb

## 2021-02-18 DIAGNOSIS — O43199 Other malformation of placenta, unspecified trimester: Secondary | ICD-10-CM

## 2021-02-18 DIAGNOSIS — O0990 Supervision of high risk pregnancy, unspecified, unspecified trimester: Secondary | ICD-10-CM

## 2021-02-18 DIAGNOSIS — O24419 Gestational diabetes mellitus in pregnancy, unspecified control: Secondary | ICD-10-CM | POA: Diagnosis not present

## 2021-02-18 DIAGNOSIS — O24410 Gestational diabetes mellitus in pregnancy, diet controlled: Secondary | ICD-10-CM

## 2021-02-18 DIAGNOSIS — Z3A35 35 weeks gestation of pregnancy: Secondary | ICD-10-CM

## 2021-02-18 NOTE — Progress Notes (Signed)
   PRENATAL VISIT NOTE  Subjective:  Natalie Pacheco is a 20 y.o. G2P1001 at [redacted]w[redacted]d being seen today for ongoing prenatal care.  She is currently monitored for the following issues for this high-risk pregnancy and has Alpha thalassemia silent carrier; SVT (supraventricular tachycardia) (HCC); Supervision of high risk pregnancy, antepartum; History of gestational hypertension; History of gestational diabetes mellitus (GDM) in prior pregnancy, currently pregnant; ADHD, predominantly inattentive type; Mild intermittent asthma without complication; Rubella non-immune status, antepartum; and Marginal insertion of umbilical cord affecting management of mother on their problem list.  Patient reports no complaints.  Contractions: Irregular. Vag. Bleeding: None.  Movement: Present. Denies leaking of fluid.   The following portions of the patient's history were reviewed and updated as appropriate: allergies, current medications, past family history, past medical history, past social history, past surgical history and problem list.   Objective:   Vitals:   02/18/21 1625  BP: 117/77  Pulse: (!) 101  Weight: 214 lb 6.4 oz (97.3 kg)    Fetal Status: Fetal Heart Rate (bpm): NST   Movement: Present     General:  Alert, oriented and cooperative. Patient is in no acute distress.  Skin: Skin is warm and dry. No rash noted.   Cardiovascular: Normal heart rate noted  Respiratory: Normal respiratory effort, no problems with respiration noted  Abdomen: Soft, gravid, appropriate for gestational age.  Pain/Pressure: Present     Pelvic: Cervical exam deferred        Extremities: Normal range of motion.     Mental Status: Normal mood and affect. Normal behavior. Normal judgment and thought content.   Assessment and Plan:  Pregnancy: G2P1001 at [redacted]w[redacted]d 1. Supervision of high risk pregnancy, antepartum - Routine OB care. Doing well, no concerns - NST/BPP today - GBS and GC/CT at next visit  2. [redacted] weeks  gestation of pregnancy   3. Diet controlled gestational diabetes mellitus (GDM) in third trimester - Patient did not bring log today. Reports she checks blood sugars but not consistently. Reports fasting numbers have been under 100 and 2 hour PP have been under 140. Reports she was told by someone that PP numbers should be under 170. Educated patient that 2 hour PP should be < 120 and that fasting numbers should be < 95 and that she really needs to bring log to next appt to determine whether or not medication needs to be started. Patient verbalizes understanding and importance   Preterm labor symptoms and general obstetric precautions including but not limited to vaginal bleeding, contractions, leaking of fluid and fetal movement were reviewed in detail with the patient. Please refer to After Visit Summary for other counseling recommendations.   Return in about 1 week (around 02/25/2021) for [facility] on 12/12, 12/19 and 12/28.  Future Appointments  Date Time Provider Department Center  02/19/2021  1:15 PM [facility] HEALTH CLINICIAN [facility]  02/25/2021  3:15 PM [facility] NST [facility]  03/04/2021  2:15 PM Eureka Bing, MD [facility]  03/04/2021  3:00 PM [facility] NURSE [facility]  03/04/2021  3:15 PM [facility] US2 [facility]  03/13/2021  1:55 PM Noralee Chars [facility]  03/13/2021  3:15 PM [facility] NST [facility]    Brand Males, CNM

## 2021-02-19 ENCOUNTER — Ambulatory Visit: Payer: Medicaid Other | Admitting: Clinical

## 2021-02-19 DIAGNOSIS — Z7189 Other specified counseling: Secondary | ICD-10-CM

## 2021-02-19 NOTE — Patient Instructions (Signed)
Center for [facility] [place], [place] [address] [phone] (main office) [phone] [facility] office)  [email or web address]  Www.[email or web address].net      BRAINSTORMING  Develop a Plan Goals: Provide a way to start conversation about your new life with a baby Assist parents in recognizing and using resources within their reach Help pave the way before birth for an easier period of transition afterwards.  Make a list of the following information to keep in a central location: Full name of Mom and Partner: _____________________________________________ 58 full name and Date of Birth: ___________________________________________ Home Address: ___________________________________________________________ ________________________________________________________________________ Home Phone: ____________________________________________________________ Parents' cell numbers: _____________________________________________________ ________________________________________________________________________ Name and contact info for OB: ______________________________________________ Name and contact info for Pediatrician:________________________________________ Contact info for Lactation Consultants: ________________________________________  REST and SLEEP *You each need at least 4-5 hours of uninterrupted sleep every day. Write specific names and contact information.* How are you going to rest in the postpartum period? While partner's home? When partner returns to work? When you both return to work? Where will your baby sleep? Who is available to help during the day? Evening? Night? Who could move in for a period to help support you? What are some ideas to help you get enough  sleep? __________________________________________________________________________________________________________________________________________________________________________________________________________________________________________ NUTRITIOUS FOOD AND DRINK *Plan for meals before your baby is born so you can have healthy food to eat during the immediate postpartum period.* Who will look after breakfast? Lunch? Dinner? List names and contact information. Brainstorm quick, healthy ideas for each meal. What can you do before baby is born to prepare meals for the postpartum period? How can others help you with meals? Which grocery stores provide online shopping and delivery? Which restaurants offer take-out or delivery options? ______________________________________________________________________________________________________________________________________________________________________________________________________________________________________________________________________________________________________________________________________________________________________________________________________  CARE FOR MOM *It's important that mom is cared for and pampered in the postpartum period. Remember, the most important ways new mothers need care are: sleep, nutrition, gentle exercise, and time off.* Who can come take care of mom during this period? Make a list of people with their contact information. List some activities that make you feel cared for, rested, and energized? Who can make sure you have opportunities to do these things? Does mom have a space of her very own within your home that's just for her? Make a "[facility]" where she can be comfortable, rest, and renew herself  daily. ______________________________________________________________________________________________________________________________________________________________________________________________________________________________________________________________________________________________________________________________________________________________________________________________________    CARE FOR AND FEEDING BABY *Knowledgeable and encouraging people will offer the best support with regard to feeding your baby.* Educate yourself and choose the best feeding option for your baby. Make a list of people who will guide, support, and be a resource for you as your care for and feed your baby. (Friends that have breastfed or are currently breastfeeding, lactation consultants, breastfeeding support groups, etc.) Consider a postpartum doula. (These websites can give you information: dona.org & BuyingShow.es) Seek out local breastfeeding resources like the breastfeeding support group at [employer] or [employer]. ______________________________________________________________________________________________________________________________________________________________________________________________________________________________________________________________________________________________________________________________________________________________________________________________________  Verner Chol AND ERRANDS Who can help with a thorough cleaning before baby is born? Make a list of people who will help with housekeeping and chores, like laundry, light cleaning, dishes, bathrooms, etc. Who can run some errands for you? What can you do to make sure you are stocked with basic supplies before baby is born? Who is going to do the  shopping? ______________________________________________________________________________________________________________________________________________________________________________________________________________________________________________________________________________________________________________________________________________________________________________________________________     Family Adjustment *Nurture yourselves.it helps parents be more loving and allows for better bonding with their child.* What sorts of things  do you and partner enjoy doing together? Which activities help you to connect and strengthen your relationship? Make a list of those things. Make a list of people whom you trust to care for your baby so you can have some time together as a couple. What types of things help partner feel connected to Mom? Make a list. What needs will partner have in order to bond with baby? Other children? Who will care for them when you go into labor and while you are in the hospital? Think about what the needs of your older children might be. Who can help you meet those needs? In what ways are you helping them prepare for bringing baby home? List some specific strategies you have for family adjustment. _______________________________________________________________________________________________________________________________________________________________________________________________________________________________________________________________________________________________________________________________________________  SUPPORT *Someone who can empathize with experiences normalizes your problems and makes them more bearable.* Make a list of other friends, neighbors, and/or co-workers you know with infants (and small children, if applicable) with whom you can connect. Make a list of local or online support groups, mom groups, etc. in which you can be  involved. ______________________________________________________________________________________________________________________________________________________________________________________________________________________________________________________________________________________________________________________________________________________________________________________________________  Childcare Plans Investigate and plan for childcare if mom is returning to work. Talk about mom's concerns about her transition back to work. Talk about partner's concerns regarding this transition.  Mental Health *Your mental health is one of the highest priorities for a pregnant or postpartum mom.* 1 in 5 women experience anxiety and/or depression from the time of conception through the first year after birth. Postpartum Mood Disorders are the #1 complication of pregnancy and childbirth and the suffering experienced by these mothers is not necessary! These illnesses are temporary and respond well to treatment, which often includes self-care, social support, talk therapy, and medication when needed. Women experiencing anxiety and depression often say things like: "I'm supposed to be happy.why do I feel so sad?", "Why can't I snap out of it?", "I'm having thoughts that scare me." There is no need to be embarrassed if you are feeling these symptoms: Overwhelmed, anxious, angry, sad, guilty, irritable, hopeless, exhausted but can't sleep You are NOT alone. You are NOT to blame. With help, you WILL be well. Where can I find help? Medical professionals such as your OB, midwife, gynecologist, family practitioner, primary care provider, pediatrician, or mental health providers; [facility] support groups: Feelings After Birth, Breastfeeding Support Group, Baby and Me Group, and Fit 4 Two exercise classes. You have permission to ask for help. It will confirm your feelings, validate your experiences,  share/learn coping strategies, and gain support and encouragement as you heal. You are important! BRAINSTORM Make a list of local resources, including resources for mom and for partner. Identify support groups. Identify people to call late at night - include names and contact info. Talk with partner about perinatal mood and anxiety disorders. Talk with your OB, midwife, and doula about baby blues and about perinatal mood and anxiety disorders. Talk with your pediatrician about perinatal mood and anxiety disorders.   Support & Sanity Savers   What do you really need?  Basics In preparing for a new baby, many expectant parents spend hours shopping for baby clothes, decorating the nursery, and deciding which car seat to buy. Yet most don't think much about what the reality of parenting a newborn will be like, and what they need to make it through that. So, here is the advice of experienced parents. We know you'll read this, and think "they're exaggerating, I don't really need that." Just trust [place] on these,  OK? Plan for all of this, and if it turns out you don't need it, come back and teach [place] how you did it!  Must-Haves (Once baby's survival needs are met, make sure you attend to your own survival needs!) Sleep An average newborn sleeps 16-18 hours per day, over 6-7 sleep periods, rarely more than three hours at a time. It is normal and healthy for a newborn to wake throughout the night... but really hard on parents!! Naps. Prioritize sleep above any responsibilities like: cleaning house, visiting friends, running errands, etc.  Sleep whenever baby sleeps. If you can't nap, at least have restful times when baby eats. The more rest you get, the more patient you will be, the more emotionally stable, and better at solving problems.  Food You may not have realized it would be difficult to eat when you have a newborn. Yet, when we talk to countless new parents, they say things like "it may be 2:00 pm  when I realize I haven't had breakfast yet." Or "every time we sit down to dinner, baby needs to eat, and my food gets cold, so I don't bother to eat it." Finger food. Before your baby is born, stock up with one months' worth of food that: 1) you can eat with one hand while holding a baby, 2) doesn't need to be prepped, 3) is good hot or cold, 4) doesn't spoil when left out for a few hours, and 5) you like to eat. Think about: nuts, dried fruit, Clif bars, pretzels, jerky, gogurt, baby carrots, apples, bananas, crackers, cheez-n-crackers, string cheese, hot pockets or frozen burritos to microwave, garden burgers and breakfast pastries to put in the toaster, yogurt drinks, etc. Restaurant Menus. Make lists of your favorite restaurants & menu items. When family/friends want to help, you can give specific information without much thought. They can either bring you the food or send gift cards for just the right meals. Freezer Meals.  Take some time to make a few meals to put in the freezer ahead of time.  Easy to freeze meals can be anything such as soup, lasagna, chicken pie, or spaghetti sauce. Set up a Meal Schedule.  Ask friends and family to sign up to bring you meals during the first few weeks of being home. (It can be passed around at baby showers!) You have no idea how helpful this will be until you are in the throes of parenting.  https://hamilton-woodard.com/ is a great website to check out. Emotional Support Know who to call when you're stressed out. Parenting a newborn is very challenging work. There are times when it totally overwhelms your normal coping abilities. EVERY NEW PARENT NEEDS TO HAVE A PLAN FOR WHO TO CALL WHEN THEY JUST CAN'T COPE ANY MORE. (And it has to be someone other than the baby's other parent!) Before your baby is born, come up with at least one person you can call for support - write their phone number down and post it on the refrigerator. Anxiety & Sadness. Baby blues are normal after  pregnancy; however, there are more severe types of anxiety & sadness which can occur and should not be ignored.  They are always treatable, but you have to take the first step by reaching out for help. [facility] offers a "Mom Talk" group which meets every Tuesday from 10 am - 11 am.  This group is for new moms who need support and connection after their babies are born.  Call [phone].  Really,  Really Helpful (Plan for them! Make sure these happen often!!) Physical Support with Taking Care of Yourselves Asking friends and family. Before your baby is born, set up a schedule of people who can come and visit and help out (or ask a friend to schedule for you). Any time someone says "let me know what I can do to help," sign them up for a day. When they get there, their job is not to take care of the baby (that's your job and your joy). Their job is to take care of you!  Postpartum doulas. If you don't have anyone you can call on for support, look into postpartum doulas:  professionals at helping parents with caring for baby, caring for themselves, getting breastfeeding started, and helping with household tasks. www.padanc.org is a helpful website for learning about doulas in our area. Peer Support / Parent Groups Why: One of the greatest ideas for new parents is to be around other new parents. Parent groups give you a chance to share and listen to others who are going through the same season of life, get a sense of what is normal infant development by watching several babies learn and grow, share your stories of triumph and struggles with empathetic ears, and forgive your own mistakes when you realize all parents are learning by trial and error. Where to find: There are many places you can meet other new parents throughout our community.  [facility] offers the following classes for new moms and their little ones:  Baby and Me (Birth to [place]) and Breastfeeding Support Group. Go to  www.conehealthybaby.com or call [phone] for more information. Time for your Relationship It's easy to get so caught up in meeting baby's immediate needs that it's hard to find time to connect with your partner, and meet the needs of your relationship. It's also easy to forget what "quality time with your partner" actually looks like. If you take your baby on a date, you'd be amazed how much of your couple time is spent feeding the baby, diapering the baby, admiring the baby, and talking about the baby. Dating: Try to take time for just the two of you. Babysitter tip: Sometimes when moms are breastfeeding a newborn, they find it hard to figure out how to schedule outings around baby's unpredictable feeding schedules. Have the babysitter come for a three hour period. When she comes over, if baby has just eaten, you can leave right away, and come back in two hours. If baby hasn't fed recently, you start the date at home. Once baby gets hungry and gets a good feeding in, you can head out for the rest of your date time. Date Nights at Home: If you can't get out, at least set aside one evening a week to prioritize your relationship: whenever baby dozes off or doesn't have any immediate needs, spend a little time focusing on each other. Potential conflicts: The main relationship conflicts that come up for new parents are: issues related to sexuality, financial stresses, a feeling of an unfair division of household tasks, and conflicts in parenting styles. The more you can work on these issues before baby arrives, the better!  Fun and Frills (Don't forget these. and don't feel guilty for indulging in them!) Everyone has something in life that is a fun little treat that they do just for themselves. It may be: reading the morning paper, or going for a daily jog, or having coffee with a friend once a week, or going to  a movie on Friday nights, or fine chocolates, or bubble baths, or curling up with a good  book. Unless you do fun things for yourself every now and then, it's hard to have the energy for fun with your baby. Whatever your "special" treats are, make sure you find a way to continue to indulge in them after your baby is born. These special moments can recharge you, and allow you to return to baby with a new joy   PERINATAL MOOD DISORDERS: [place]   _________________________________________Emergency and Crisis Resources If you are an imminent risk to self or others, are experiencing intense personal distress, and/or have noticed significant changes in activities of daily living, call:  [place]: [phone]  [address], [place], [place], [address] Mobile Crisis: [place]: [phone] Or visit the following crisis centers: Local Emergency Departments [facility]: [address] [place]. Hours: 8:30AM-5PM. Insurance Accepted: Medicaid, Medicare, and Uninsured.  [facility]:  [address] [place]  Mon-Friday 8am-3pm, [phone]                                                                                  ___________ Non-Crisis Resources To identify specific providers that are covered by your insurance, contact your insurance company or local agencies:  [place]: [phone] [facility] [facility] [employer]: [place]: [phone] Postpartum Support International- Warm-line: 579-629-8599                                                      __Outpatient Therapy and Medication Management   Providers:  [facility]: [phone] Hours: 9AM-5PM  Insurance Accepted: Alben Spittle, Shane Crutch, [place], [place] [facility] Care): [phone] Hours: 8AM-5:30PM  nsurance Accepted: All insurances EXCEPT AARP, [place],  [place], and [place]: [phone] Hours: 8AM-8PM Insurance Accepted: Cristal Ford, [name], [place], Medicare, [phone] [facility]: [phone] Hours: 8:30AM-7PM Insurance Accepted: Cristal Ford, Medicaid, Medicare, Tricare, [employer] Counseling:  [place] Accepted:  Holland Falling, Lorella Nimrod, [employer], [place]: [phone] Hours: 9AM-5:30PM Insurance Accepted: Alben Spittle, Charlotte Crumb, and Medicaid, Medicare, [facility]:  [phone] Hours: 9am-5pm Insurance Accepted: BCBS; they do not accept Medicaid/Medicare [facility] [place]: [phone] Hours: 9am-9pm Insurance Accepted: All major insurance including Medicaid and Medicare [facility]: [phone] Hours: [place] Accepted: All insurances EXCEPT Medicaid and Medicare. [facility]: [phone]   ____________  Parenting Support Groups [facility]: [phone] [facility]:  [phone] Family Support Network: (support for children in the NICU and/or with special needs), [phone]   ___________                                                                 Mental Health Support Groups Mental Health Association: [phone]    _____________                                                                                  Online Resources Postpartum Support International: http://www.postpartum.net/  800-944-4PPD 2Moms Supporting Moms:  www.momssupportingmoms.net    

## 2021-02-25 ENCOUNTER — Ambulatory Visit (INDEPENDENT_AMBULATORY_CARE_PROVIDER_SITE_OTHER): Payer: Medicaid Other

## 2021-02-25 ENCOUNTER — Other Ambulatory Visit: Payer: Self-pay

## 2021-02-25 ENCOUNTER — Ambulatory Visit: Payer: Medicaid Other | Admitting: *Deleted

## 2021-02-25 VITALS — BP 115/55 | HR 69

## 2021-02-25 DIAGNOSIS — O43199 Other malformation of placenta, unspecified trimester: Secondary | ICD-10-CM | POA: Diagnosis not present

## 2021-02-25 DIAGNOSIS — O24410 Gestational diabetes mellitus in pregnancy, diet controlled: Secondary | ICD-10-CM | POA: Diagnosis not present

## 2021-02-25 NOTE — Progress Notes (Signed)
Pt informed that the ultrasound is considered a limited OB ultrasound and is not intended to be a complete ultrasound exam.  Patient also informed that the ultrasound is not being completed with the intent of assessing for fetal or placental anomalies or any pelvic abnormalities.  Explained that the purpose of today's ultrasound is to assess for presentation, BPP and amniotic fluid volume.  Patient acknowledges the purpose of the exam and the limitations of the study.   Pt reports that she is checking CBG 3 times daily. Results for last 6 days were reviewed -occasionally some fastings are above 90, one PP CBG >120.

## 2021-03-04 ENCOUNTER — Ambulatory Visit: Payer: Medicaid Other | Attending: Obstetrics and Gynecology

## 2021-03-04 ENCOUNTER — Ambulatory Visit: Payer: Medicaid Other | Admitting: *Deleted

## 2021-03-04 ENCOUNTER — Other Ambulatory Visit: Payer: Self-pay

## 2021-03-04 ENCOUNTER — Ambulatory Visit (INDEPENDENT_AMBULATORY_CARE_PROVIDER_SITE_OTHER): Payer: Medicaid Other | Admitting: Obstetrics and Gynecology

## 2021-03-04 ENCOUNTER — Other Ambulatory Visit (HOSPITAL_COMMUNITY)
Admission: RE | Admit: 2021-03-04 | Discharge: 2021-03-04 | Disposition: A | Discharge status: Home / Self Care | Payer: Medicaid Other | Source: Ambulatory Visit | Attending: Obstetrics and Gynecology | Admitting: Obstetrics and Gynecology

## 2021-03-04 ENCOUNTER — Encounter: Payer: Self-pay | Admitting: *Deleted

## 2021-03-04 VITALS — BP 113/57 | HR 81 | Wt 217.8 lb

## 2021-03-04 VITALS — BP 128/58 | HR 74

## 2021-03-04 DIAGNOSIS — O09299 Supervision of pregnancy with other poor reproductive or obstetric history, unspecified trimester: Secondary | ICD-10-CM | POA: Insufficient documentation

## 2021-03-04 DIAGNOSIS — Z3A37 37 weeks gestation of pregnancy: Secondary | ICD-10-CM

## 2021-03-04 DIAGNOSIS — O0993 Supervision of high risk pregnancy, unspecified, third trimester: Secondary | ICD-10-CM | POA: Diagnosis present

## 2021-03-04 DIAGNOSIS — O09293 Supervision of pregnancy with other poor reproductive or obstetric history, third trimester: Secondary | ICD-10-CM

## 2021-03-04 DIAGNOSIS — O0990 Supervision of high risk pregnancy, unspecified, unspecified trimester: Secondary | ICD-10-CM

## 2021-03-04 DIAGNOSIS — Z8759 Personal history of other complications of pregnancy, childbirth and the puerperium: Secondary | ICD-10-CM

## 2021-03-04 DIAGNOSIS — O99213 Obesity complicating pregnancy, third trimester: Secondary | ICD-10-CM | POA: Diagnosis not present

## 2021-03-04 DIAGNOSIS — Z8632 Personal history of gestational diabetes: Secondary | ICD-10-CM | POA: Diagnosis present

## 2021-03-04 DIAGNOSIS — O24410 Gestational diabetes mellitus in pregnancy, diet controlled: Secondary | ICD-10-CM | POA: Insufficient documentation

## 2021-03-04 DIAGNOSIS — O43199 Other malformation of placenta, unspecified trimester: Secondary | ICD-10-CM

## 2021-03-04 DIAGNOSIS — O43193 Other malformation of placenta, third trimester: Secondary | ICD-10-CM | POA: Diagnosis not present

## 2021-03-04 DIAGNOSIS — E669 Obesity, unspecified: Secondary | ICD-10-CM

## 2021-03-04 NOTE — Progress Notes (Signed)
° ° °  PRENATAL VISIT NOTE  Subjective:  Natalie Pacheco is a 20 y.o. G2P1001 at [redacted]w[redacted]d being seen today for ongoing prenatal care.  She is currently monitored for the following issues for this high-risk pregnancy and has Alpha thalassemia silent carrier; SVT (supraventricular tachycardia) (HCC); GDM (gestational diabetes mellitus), class A1; Supervision of high risk pregnancy, antepartum; History of gestational hypertension; History of gestational diabetes mellitus (GDM) in prior pregnancy, currently pregnant; ADHD, predominantly inattentive type; Mild intermittent asthma without complication; Rubella non-immune status, antepartum; and Marginal insertion of umbilical cord affecting management of mother on their problem list.  Patient reports no complaints.  Contractions: Irritability. Vag. Bleeding: None.  Movement: Present. Denies leaking of fluid.   The following portions of the patient's history were reviewed and updated as appropriate: allergies, current medications, past family history, past medical history, past social history, past surgical history and problem list.   Objective:   Vitals:   03/04/21 1406  BP: (!) 113/57  Pulse: 81  Weight: 217 lb 12.8 oz (98.8 kg)    Fetal Status: Fetal Heart Rate (bpm): 147   Movement: Present     General:  Alert, oriented and cooperative. Patient is in no acute distress.  Skin: Skin is warm and dry. No rash noted.   Cardiovascular: Normal heart rate noted  Respiratory: Normal respiratory effort, no problems with respiration noted  Abdomen: Soft, gravid, appropriate for gestational age.  Pain/Pressure: Present     Pelvic: Cervical exam performed in the presence of a chaperone Dilation: 1 Effacement (%): 50 Station: Ballotable  Extremities: Normal range of motion.  Edema: Trace  Mental Status: Normal mood and affect. Normal behavior. Normal judgment and thought content.   Assessment and Plan:  Pregnancy: G2P1001 at [redacted]w[redacted]d 1. [redacted] weeks gestation  of pregnancy - GC/Chlamydia probe amp ([place])not at [facility] - Culture, beta strep (group b only)  2. Marginal insertion of umbilical cord affecting management of mother Has u/s and bpp later today  3. Supervision of high risk pregnancy, antepartum  4. GDM (gestational diabetes mellitus), class A1 Watch am fasting sugars (high 90s on close to 25%). F/u growth today. Set up EDC IOL nv  Term labor symptoms and general obstetric precautions including but not limited to vaginal bleeding, contractions, leaking of fluid and fetal movement were reviewed in detail with the patient. Please refer to After Visit Summary for other counseling recommendations.   Return in about 9 days (around 03/13/2021) for in person, md or app, nst/bpp with diane, high risk ob.  Future Appointments  Date Time Provider Department Center  03/04/2021  3:00 PM [facility] NURSE [facility]  03/04/2021  3:15 PM [facility] US2 [facility]  03/13/2021  1:55 PM Noralee Chars [facility]  03/13/2021  3:15 PM [facility] NST [facility]  04/09/2021  1:15 PM [facility]     Bing, MD

## 2021-03-05 LAB — GC/CHLAMYDIA PROBE AMP (~~LOC~~) NOT AT ARMC
Chlamydia: NEGATIVE
Comment: NEGATIVE
Comment: NORMAL
Neisseria Gonorrhea: NEGATIVE

## 2021-03-08 LAB — CULTURE, BETA STREP (GROUP B ONLY): Strep Gp B Culture: NEGATIVE

## 2021-03-13 ENCOUNTER — Ambulatory Visit: Payer: Medicaid Other | Admitting: *Deleted

## 2021-03-13 ENCOUNTER — Ambulatory Visit (INDEPENDENT_AMBULATORY_CARE_PROVIDER_SITE_OTHER): Payer: Medicaid Other

## 2021-03-13 ENCOUNTER — Other Ambulatory Visit: Payer: Self-pay

## 2021-03-13 ENCOUNTER — Ambulatory Visit (INDEPENDENT_AMBULATORY_CARE_PROVIDER_SITE_OTHER): Payer: Medicaid Other | Admitting: Family Medicine

## 2021-03-13 VITALS — BP 115/49 | HR 92 | Wt 219.1 lb

## 2021-03-13 DIAGNOSIS — I471 Supraventricular tachycardia: Secondary | ICD-10-CM

## 2021-03-13 DIAGNOSIS — O24410 Gestational diabetes mellitus in pregnancy, diet controlled: Secondary | ICD-10-CM

## 2021-03-13 DIAGNOSIS — O43199 Other malformation of placenta, unspecified trimester: Secondary | ICD-10-CM

## 2021-03-13 DIAGNOSIS — Z2839 Other underimmunization status: Secondary | ICD-10-CM

## 2021-03-13 DIAGNOSIS — O0990 Supervision of high risk pregnancy, unspecified, unspecified trimester: Secondary | ICD-10-CM

## 2021-03-13 DIAGNOSIS — O09899 Supervision of other high risk pregnancies, unspecified trimester: Secondary | ICD-10-CM

## 2021-03-13 NOTE — Progress Notes (Signed)
IOL scheduled for 03/28/21 in AM

## 2021-03-13 NOTE — Progress Notes (Signed)
Subjective:  Natalie Pacheco is a 20 y.o. G2P1001 at [redacted]w[redacted]d being seen today for ongoing prenatal care.  She is currently monitored for the following issues for this high-risk pregnancy and has Alpha thalassemia silent carrier; SVT (supraventricular tachycardia) (HCC); GDM (gestational diabetes mellitus), class A1; Supervision of high risk pregnancy, antepartum; History of gestational hypertension; History of gestational diabetes mellitus (GDM) in prior pregnancy, currently pregnant; ADHD, predominantly inattentive type; Mild intermittent asthma without complication; Rubella non-immune status, antepartum; and Marginal insertion of umbilical cord affecting management of mother on their problem list.  GDM: Patient diet controlled.  Reports no hypoglycemic episodes.  Tolerating medication well. Did not bring log, but reports Fasting: less than 95 2hr PP: less than 120  Patient reports occasional contractions.  Contractions: Irritability. Vag. Bleeding: None.  Movement: Present. Denies leaking of fluid.   The following portions of the patient's history were reviewed and updated as appropriate: allergies, current medications, past family history, past medical history, past social history, past surgical history and problem list. Problem list updated.  Objective:   Vitals:   03/13/21 1413  BP: (!) 115/49  Pulse: 92  Weight: 219 lb 1.6 oz (99.4 kg)    Fetal Status: Fetal Heart Rate (bpm): 152   Movement: Present     General:  Alert, oriented and cooperative. Patient is in no acute distress.  Skin: Skin is warm and dry. No rash noted.   Cardiovascular: Normal heart rate noted  Respiratory: Normal respiratory effort, no problems with respiration noted  Abdomen: Soft, gravid, appropriate for gestational age. Pain/Pressure: Present     Pelvic: Vag. Bleeding: None     Cervical exam deferred        Extremities: Normal range of motion.  Edema: Trace  Mental Status: Normal mood and affect. Normal  behavior. Normal judgment and thought content.   Urinalysis:      Assessment and Plan:  Pregnancy: G2P1001 at [redacted]w[redacted]d  1. Supervision of high risk pregnancy, antepartum FHT and FH normal  2. Rubella non-immune status, antepartum  3. Marginal insertion of umbilical cord affecting management of mother NST/BPP today  4. GDM (gestational diabetes mellitus), class A1 Controlled. Recommended delivery at 40 weeks. Patient desires [redacted]w[redacted]d. Discussed risks of still birth. Will need testing. Patient agreable.  5. SVT (supraventricular tachycardia) (HCC) Stable  Term labor symptoms and general obstetric precautions including but not limited to vaginal bleeding, contractions, leaking of fluid and fetal movement were reviewed in detail with the patient. Please refer to After Visit Summary for other counseling recommendations.  No follow-ups on file.   Levie Heritage, DO

## 2021-03-17 NOTE — L&D Delivery Note (Signed)
OB/GYN Faculty Practice Delivery Note  Natalie Pacheco is a 21 y.o. [id number] s/p SVD at [redacted]w[redacted]d. She was admitted for SOL.   ROM: 0h 29m with meconium stained fluid GBS Status: Negative   Delivery Date/Time: 03/27/20 at 0047  Delivery: Called to room and patient was complete and pushing. Head delivered occiput anterior. No nuchal cord present. Shoulder and body delivered in usual fashion. Infant with spontaneous cry, placed on mother's abdomen, dried and stimulated. Cord clamped x 2 after 1-minute delay and cut by FOB under my direct supervision. Cord blood drawn. Placenta delivered spontaneously with gentle cord traction. Fundus firm with massage and Pitocin. Labia, perineum, vagina, and cervix were inspected, and patient was found to have a 1st degree perineal laceration that was hemostatic and not repaired.   Placenta: Intact, 3VC - sent to L&D Complications: None  Lacerations: 1st degree perineal  EBL: 125 cc Analgesia: None   Infant: Viable female   APGARs 70 and 53  Evalina Field, MD OB/GYN Fellow, Faculty Practice

## 2021-03-20 NOTE — Progress Notes (Signed)
Patient was assessed and managed by nursing staff during this encounter. I have reviewed the chart and agree with the documentation and plan. I have also made any necessary editorial changes. ° °Brizeida Mcmurry, MD °03/20/2021 10:45 AM  ° °

## 2021-03-21 ENCOUNTER — Other Ambulatory Visit: Payer: Self-pay | Admitting: Advanced Practice Midwife

## 2021-03-21 ENCOUNTER — Other Ambulatory Visit: Payer: Self-pay

## 2021-03-21 ENCOUNTER — Ambulatory Visit (INDEPENDENT_AMBULATORY_CARE_PROVIDER_SITE_OTHER): Payer: Medicaid Other

## 2021-03-21 ENCOUNTER — Ambulatory Visit (INDEPENDENT_AMBULATORY_CARE_PROVIDER_SITE_OTHER): Payer: Medicaid Other | Admitting: Family Medicine

## 2021-03-21 VITALS — BP 112/53 | HR 82 | Wt 222.3 lb

## 2021-03-21 DIAGNOSIS — Z8759 Personal history of other complications of pregnancy, childbirth and the puerperium: Secondary | ICD-10-CM

## 2021-03-21 DIAGNOSIS — O43199 Other malformation of placenta, unspecified trimester: Secondary | ICD-10-CM | POA: Diagnosis not present

## 2021-03-21 DIAGNOSIS — O24410 Gestational diabetes mellitus in pregnancy, diet controlled: Secondary | ICD-10-CM | POA: Diagnosis not present

## 2021-03-21 DIAGNOSIS — Z2839 Other underimmunization status: Secondary | ICD-10-CM

## 2021-03-21 DIAGNOSIS — O0990 Supervision of high risk pregnancy, unspecified, unspecified trimester: Secondary | ICD-10-CM

## 2021-03-21 DIAGNOSIS — O09899 Supervision of other high risk pregnancies, unspecified trimester: Secondary | ICD-10-CM

## 2021-03-21 DIAGNOSIS — I471 Supraventricular tachycardia: Secondary | ICD-10-CM

## 2021-03-21 NOTE — Progress Notes (Addendum)
° °  PRENATAL VISIT NOTE  Subjective:  Natalie Pacheco is a 21 y.o. G2P1001 at [redacted]w[redacted]d being seen today for ongoing prenatal care.  She is currently monitored for the following issues for this high-risk pregnancy and has Alpha thalassemia silent carrier; SVT (supraventricular tachycardia) (HCC); GDM (gestational diabetes mellitus), class A1; Supervision of high risk pregnancy, antepartum; History of gestational hypertension; History of gestational diabetes mellitus (GDM) in prior pregnancy, currently pregnant; ADHD, predominantly inattentive type; Mild intermittent asthma without complication; Rubella non-immune status, antepartum; and Marginal insertion of umbilical cord affecting management of mother on their problem list.  Patient reports no complaints.  Contractions: Irregular. Vag. Bleeding: None.  Movement: Present. Denies leaking of fluid.   The following portions of the patient's history were reviewed and updated as appropriate: allergies, current medications, past family history, past medical history, past social history, past surgical history and problem list.   Objective:   Vitals:   03/21/21 1609  BP: (!) 112/53  Pulse: 82  Weight: 222 lb 4.8 oz (100.8 kg)    Fetal Status: Fetal Heart Rate (bpm): RNST   Movement: Present     General:  Alert, oriented and cooperative. Patient is in no acute distress.  Skin: Skin is warm and dry. No rash noted.   Cardiovascular: Normal heart rate noted  Respiratory: Normal respiratory effort, no problems with respiration noted  Abdomen: Soft, gravid, appropriate for gestational age.  Pain/Pressure: Present     Pelvic: Cervical exam deferred        Extremities: Normal range of motion.     Mental Status: Normal mood and affect. Normal behavior. Normal judgment and thought content.  NST:  Baseline: 150 bpm, Variability: Good {> 6 bpm), Accelerations: Reactive, and Decelerations: Absent  Assessment and Plan:  Pregnancy: G2P1001 at [redacted]w[redacted]d 1.  Marginal insertion of umbilical cord affecting management of mother In antenatal testing - US FETAL BPP W/NONSTRESS; Future  2. Diet controlled gestational diabetes mellitus (GDM) in third trimester No book today, reports some abnormal values if non-compliant with diet. Testing is reassuring Last growth reveals EFW 3519 gm, 7 lb 12 oz 90% at 37 weeks Offered earlier delivery--she does not want IOL until after 40 weeks - US FETAL BPP W/NONSTRESS; Future  3. SVT (supraventricular tachycardia) (HCC) stable  4. 39 weeks  5. Supervision of high risk pregnancy, antepartum  6. Rubella non-immune status, antepartum Needs MMR pp  7. History of gestational hypertension BP is well controlled today On ASA  Term labor symptoms and general obstetric precautions including but not limited to vaginal bleeding, contractions, leaking of fluid and fetal movement were reviewed in detail with the patient. Please refer to After Visit Summary for other counseling recommendations.   Return for No appt needed.  Future Appointments  Date Time Provider [place]  03/28/2021  6:30 AM [facility] [place] [facility]  04/09/2021  1:15 PM [facility]    Donnamae Jude, MD

## 2021-03-21 NOTE — Progress Notes (Signed)
IOL scheduled on 03/28/21

## 2021-03-22 ENCOUNTER — Telehealth (HOSPITAL_COMMUNITY): Payer: Self-pay | Admitting: *Deleted

## 2021-03-22 NOTE — Telephone Encounter (Signed)
Preadmission screen  

## 2021-03-24 ENCOUNTER — Other Ambulatory Visit: Payer: Self-pay | Admitting: Family Medicine

## 2021-03-26 ENCOUNTER — Other Ambulatory Visit: Payer: Self-pay | Admitting: Family Medicine

## 2021-03-26 ENCOUNTER — Telehealth (HOSPITAL_COMMUNITY): Payer: Self-pay | Admitting: *Deleted

## 2021-03-26 ENCOUNTER — Encounter (HOSPITAL_COMMUNITY): Payer: Self-pay

## 2021-03-26 ENCOUNTER — Inpatient Hospital Stay (HOSPITAL_COMMUNITY)
Admission: AD | Admit: 2021-03-26 | Discharge: 2021-03-28 | DRG: 807 | Disposition: A | Discharge status: Home / Self Care | Payer: Medicaid Other | Attending: Obstetrics and Gynecology | Admitting: Obstetrics and Gynecology

## 2021-03-26 ENCOUNTER — Encounter (HOSPITAL_COMMUNITY): Payer: Self-pay | Admitting: *Deleted

## 2021-03-26 DIAGNOSIS — O9952 Diseases of the respiratory system complicating childbirth: Secondary | ICD-10-CM | POA: Diagnosis present

## 2021-03-26 DIAGNOSIS — O24410 Gestational diabetes mellitus in pregnancy, diet controlled: Secondary | ICD-10-CM | POA: Diagnosis present

## 2021-03-26 DIAGNOSIS — Z20822 Contact with and (suspected) exposure to covid-19: Secondary | ICD-10-CM | POA: Diagnosis present

## 2021-03-26 DIAGNOSIS — O480 Post-term pregnancy: Secondary | ICD-10-CM | POA: Diagnosis not present

## 2021-03-26 DIAGNOSIS — O0990 Supervision of high risk pregnancy, unspecified, unspecified trimester: Secondary | ICD-10-CM

## 2021-03-26 DIAGNOSIS — O43123 Velamentous insertion of umbilical cord, third trimester: Secondary | ICD-10-CM | POA: Diagnosis present

## 2021-03-26 DIAGNOSIS — D563 Thalassemia minor: Secondary | ICD-10-CM | POA: Diagnosis present

## 2021-03-26 DIAGNOSIS — O24420 Gestational diabetes mellitus in childbirth, diet controlled: Secondary | ICD-10-CM | POA: Diagnosis present

## 2021-03-26 DIAGNOSIS — O26893 Other specified pregnancy related conditions, third trimester: Secondary | ICD-10-CM | POA: Diagnosis present

## 2021-03-26 DIAGNOSIS — Z3A40 40 weeks gestation of pregnancy: Secondary | ICD-10-CM | POA: Diagnosis not present

## 2021-03-26 DIAGNOSIS — Z2839 Other underimmunization status: Secondary | ICD-10-CM

## 2021-03-26 DIAGNOSIS — Z23 Encounter for immunization: Secondary | ICD-10-CM | POA: Diagnosis not present

## 2021-03-26 DIAGNOSIS — O43199 Other malformation of placenta, unspecified trimester: Secondary | ICD-10-CM | POA: Diagnosis present

## 2021-03-26 DIAGNOSIS — J4520 Mild intermittent asthma, uncomplicated: Secondary | ICD-10-CM | POA: Diagnosis present

## 2021-03-26 DIAGNOSIS — Z3490 Encounter for supervision of normal pregnancy, unspecified, unspecified trimester: Secondary | ICD-10-CM

## 2021-03-26 DIAGNOSIS — O09899 Supervision of other high risk pregnancies, unspecified trimester: Secondary | ICD-10-CM

## 2021-03-26 MED ORDER — LIDOCAINE HCL (PF) 1 % IJ SOLN: 30.0000 mL | INTRAMUSCULAR | Status: DC | PRN

## 2021-03-26 MED ORDER — LACTATED RINGERS IV SOLN: INTRAVENOUS | Status: DC

## 2021-03-26 MED ORDER — OXYCODONE-ACETAMINOPHEN 5-325 MG PO TABS: 1.0000 | ORAL_TABLET | ORAL | Status: DC | PRN

## 2021-03-26 MED ORDER — OXYTOCIN-SODIUM CHLORIDE 30-0.9 UT/500ML-% IV SOLN
2.5000 [IU]/h | INTRAVENOUS | Status: DC
  Administered 2021-03-27: 2.5 [IU]/h via INTRAVENOUS
  Filled 2021-03-26: qty 500

## 2021-03-26 MED ORDER — OXYCODONE-ACETAMINOPHEN 5-325 MG PO TABS: 2.0000 | ORAL_TABLET | ORAL | Status: DC | PRN

## 2021-03-26 MED ORDER — ONDANSETRON HCL 4 MG/2ML IJ SOLN: 4.0000 mg | Freq: Four times a day (QID) | INTRAMUSCULAR | Status: DC | PRN

## 2021-03-26 MED ORDER — LACTATED RINGERS IV SOLN: 500.0000 mL | INTRAVENOUS | Status: DC | PRN

## 2021-03-26 MED ORDER — ACETAMINOPHEN 325 MG PO TABS: 650.0000 mg | ORAL_TABLET | ORAL | Status: DC | PRN

## 2021-03-26 MED ORDER — SOD CITRATE-CITRIC ACID 500-334 MG/5ML PO SOLN: 30.0000 mL | ORAL | Status: DC | PRN

## 2021-03-26 MED ORDER — OXYTOCIN BOLUS FROM INFUSION
333.0000 mL | Freq: Once | INTRAVENOUS | Status: AC
  Administered 2021-03-27: 333 mL via INTRAVENOUS

## 2021-03-26 NOTE — BH Specialist Note (Signed)
Pt did not arrive to video visit and did not answer the phone; Left HIPPA-compliant message to call back Natalie Pacheco from [facility] at  [phone] (Gelene Recktenwald's office).  ?; left MyChart message for patient.  ? ?

## 2021-03-26 NOTE — MAU Note (Incomplete)
BROUGHT FROM LOBBY VE 8 - BULGING BAG

## 2021-03-26 NOTE — H&P (Signed)
OBSTETRIC ADMISSION HISTORY AND PHYSICAL  Natalie Pacheco is a 21 y.o. female G2P1001 with IUP at [redacted]w[redacted]d by LMP presenting for SOL. She reports +FMs, no LOF, no VB, no blurry vision, headaches, peripheral edema, or RUQ pain. Her contractions started around 9 PM earlier this evening. She plans on breast and formula feeding. She is planning to use Depo for birth control postpartum.  She received her prenatal care at [facility]  Dating: By LMP --->  Estimated Date of Delivery: 03/25/21  Sono:   '@[redacted]w[redacted]d'$ , CWD, normal anatomy, cephalic presentation, posterior fundal placental lie, 3519g, 90% EFW  Prenatal History/Complications:  GDM, diet controlled  Carrier for alpha thalassemia  Marginal cord insertion Rubella non-immune   Past Medical History: Past Medical History:  Diagnosis Date   Abscess of buttock    ADHD (attention deficit hyperactivity disorder)    Asthma    DKA (diabetic ketoacidosis) ([place]) 06/16/2019   Gestational diabetes     Past Surgical History: Past Surgical History:  Procedure Laterality Date   CYST EXCISION N/A 07/17/2017   Procedure: EXCISION OF INFECTED GLUTEAL CYST;  Surgeon: Clovis Riley, MD;  Location: [place];  Service: General;  Laterality: N/A;   DENTAL SURGERY     INCISION AND DRAINAGE ABSCESS     Buttocks   TONSILLECTOMY      Obstetrical History: OB History     Gravida  2   Para  1   Term  1   Preterm      AB      Living  1      SAB      IAB      Ectopic      Multiple  0   Live Births  1           Social History Social History   Socioeconomic History   Marital status: Single    Spouse name: Not on file   Number of children: Not on file   Years of education: Not on file   Highest education level: High school graduate  Occupational History   Occupation: Unemployed  Tobacco Use   Smoking status: Never    Passive exposure: Yes   Smokeless tobacco: Never   Tobacco comments:    Mother smokes outside  of the home  Vaping Use   Vaping Use: Never used  Substance and Sexual Activity   Alcohol use: No   Drug use: Never   Sexual activity: Yes    Birth control/protection: None  Other Topics Concern   Not on file  Social History Narrative   Born at 36 weeks, no pregnancy or delivery complication, no NICU.   Had no anesthesia complications with previous surgeries and no family history of anesthesia complications.   Renuka lives with significant other.    Social Determinants of Health   Financial Resource Strain: Not on file  Food Insecurity: No Food Insecurity   Worried About Charity fundraiser in the Last Year: Never true   Ran Out of Food in the Last Year: Never true  Transportation Needs: No Transportation Needs   Lack of Transportation (Medical): No   Lack of Transportation (Non-Medical): No  Physical Activity: Not on file  Stress: Not on file  Social Connections: Not on file    Family History: No family history on file.  Allergies: No Known Allergies  Medications Prior to Admission  Medication Sig Dispense Refill Last Dose   Accu-Chek Softclix Lancets lancets Use as instructed  100 each 12    albuterol (PROVENTIL) (2.5 MG/3ML) 0.083% nebulizer solution Take 2.5 mg by nebulization every 6 (six) hours as needed for wheezing or shortness of breath.      albuterol (VENTOLIN HFA) 108 (90 Base) MCG/ACT inhaler Inhale 1-2 puffs into the lungs every 6 (six) hours as needed for wheezing or shortness of breath. 6.7 g 46.7    aspirin EC 81 MG tablet Take 1 tablet (81 mg total) by mouth daily. Swallow whole. 30 tablet 11    Blood Glucose Monitoring Suppl (ACCU-CHEK GUIDE) w/Device KIT 1 Device by Does not apply route in the morning, at noon, in the evening, and at bedtime. 1 kit 0    glucose blood (ACCU-CHEK GUIDE) test strip Use as instructed 100 each 12    Misc. Devices (GOJJI WEIGHT SCALE) MISC 1 Device by Does not apply route as needed. 1 each 0    prenatal vitamin w/FE, FA  (PRENATAL 1 + 1) 27-1 MG TABS tablet Take 1 tablet by mouth daily at 12 noon. 30 tablet 6      Review of Systems  All systems reviewed and negative except as stated in HPI  Last menstrual period 06/18/2020, currently breastfeeding.  General appearance: alert, cooperative, and no distress Lungs: normal work of breathing on room air  Heart: normal rate, warm and well perfused  Abdomen: soft, non-tender, gravid  Extremities: no LE edema or calf tenderness to palpation   Presentation:  Cephalic Fetal monitoring: Baseline 145, moderate variability, + accels, no decels  Uterine activity: Contractions every 2-3 minutes Dilation: 8 Effacement (%): 90 Station: -2 Exam by:: Haynes Bast CNM   Prenatal labs: ABO, Rh: A/Positive/-- (07/05 1041) Antibody: Negative (10/19 0901) Rubella: <20.0 (07/05 1041) RPR: Non Reactive (10/19 0901)  HBsAg: Negative (07/05 1041)  HIV: Non Reactive (10/19 0901)  GBS: Negative/-- (12/19 1414)  Genetic screening - LR NIPS, AFP normal, carrier for alpha thalassemia  Anatomy US normal   Prenatal Transfer Tool  Maternal Diabetes: Yes:  Diabetes Type:  Diet controlled Genetic Screening: LR NIPS, AFP normal, carrier for alpha thalassemia  Maternal Ultrasounds/Referrals: Marginal cord insertion, otherwise normal  Fetal Ultrasounds or other Referrals:  Fetal echo - normal  Maternal Substance Abuse:  No Significant Maternal Medications:  None Significant Maternal Lab Results: Group B Strep negative  No results found for this or any previous visit (from the past 24 hour(s)).  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 G2P1001 at [redacted]w[redacted]d here for SOL.   #Labor: Active labor. Will continue expectant management. Anticipate SVD.  #Pain: Requesting nitrous oxide gas #FWB: Cat 1 #ID:  GBS neg #MOF: Breast/formula  #MOC: Depo  #Circ:  Desires   #A1GDM: Plan for q2hr glucose checks in active phase. Last growth EFW 90%, 3519 g at 37 weeks. Pelvis proven to 3751 g.  Genia Del, MD  03/26/2021, 11:54 PM

## 2021-03-26 NOTE — Telephone Encounter (Signed)
Preadmission screen  

## 2021-03-27 ENCOUNTER — Encounter (HOSPITAL_COMMUNITY): Payer: Self-pay | Admitting: Family Medicine

## 2021-03-27 DIAGNOSIS — O24420 Gestational diabetes mellitus in childbirth, diet controlled: Secondary | ICD-10-CM | POA: Diagnosis not present

## 2021-03-27 DIAGNOSIS — O480 Post-term pregnancy: Secondary | ICD-10-CM | POA: Diagnosis not present

## 2021-03-27 DIAGNOSIS — Z3A40 40 weeks gestation of pregnancy: Secondary | ICD-10-CM

## 2021-03-27 LAB — CBC
HCT: 43.3 % (ref 36.0–46.0)
Hemoglobin: 13.9 g/dL (ref 12.0–15.0)
MCH: 23.9 pg — ABNORMAL LOW (ref 26.0–34.0)
MCHC: 32.1 g/dL (ref 30.0–36.0)
MCV: 74.4 fL — ABNORMAL LOW (ref 80.0–100.0)
Platelets: 315 10*3/uL (ref 150–400)
RBC: 5.82 MIL/uL — ABNORMAL HIGH (ref 3.87–5.11)
RDW: 15.1 % (ref 11.5–15.5)
WBC: 17.1 10*3/uL — ABNORMAL HIGH (ref 4.0–10.5)
nRBC: 0 % (ref 0.0–0.2)

## 2021-03-27 LAB — TYPE AND SCREEN
ABO/RH(D): A POS
Antibody Screen: NEGATIVE

## 2021-03-27 LAB — RESP PANEL BY RT-PCR (FLU A&B, COVID) ARPGX2
Influenza A by PCR: NEGATIVE
Influenza B by PCR: NEGATIVE
SARS Coronavirus 2 by RT PCR: NEGATIVE

## 2021-03-27 LAB — SARS CORONAVIRUS 2 (TAT 6-24 HRS): SARS Coronavirus 2: NEGATIVE

## 2021-03-27 LAB — RPR: RPR Ser Ql: NONREACTIVE

## 2021-03-27 MED ORDER — COCONUT OIL OIL: 1.0000 "application " | TOPICAL_OIL | Status: DC | PRN

## 2021-03-27 MED ORDER — ONDANSETRON HCL 4 MG/2ML IJ SOLN: 4.0000 mg | INTRAMUSCULAR | Status: DC | PRN

## 2021-03-27 MED ORDER — ACETAMINOPHEN 325 MG PO TABS: 650.0000 mg | ORAL_TABLET | ORAL | Status: DC | PRN

## 2021-03-27 MED ORDER — DIPHENHYDRAMINE HCL 25 MG PO CAPS: 25.0000 mg | ORAL_CAPSULE | Freq: Four times a day (QID) | ORAL | Status: DC | PRN

## 2021-03-27 MED ORDER — CALCIUM CARBONATE ANTACID 500 MG PO CHEW
1.0000 | CHEWABLE_TABLET | Freq: Every day | ORAL | Status: DC
  Administered 2021-03-27 (×2): 200 mg via ORAL
  Filled 2021-03-27 (×3): qty 1

## 2021-03-27 MED ORDER — PRENATAL MULTIVITAMIN CH
1.0000 | ORAL_TABLET | Freq: Every day | ORAL | Status: DC
  Administered 2021-03-27 – 2021-03-28 (×2): 1 via ORAL
  Filled 2021-03-27 (×2): qty 1

## 2021-03-27 MED ORDER — BENZOCAINE-MENTHOL 20-0.5 % EX AERO: 1.0000 "application " | INHALATION_SPRAY | CUTANEOUS | Status: DC | PRN

## 2021-03-27 MED ORDER — WITCH HAZEL-GLYCERIN EX PADS: 1.0000 "application " | MEDICATED_PAD | CUTANEOUS | Status: DC | PRN

## 2021-03-27 MED ORDER — MEDROXYPROGESTERONE ACETATE 150 MG/ML IM SUSP
150.0000 mg | INTRAMUSCULAR | Status: DC | PRN
  Filled 2021-03-27: qty 1

## 2021-03-27 MED ORDER — MEASLES, MUMPS & RUBELLA VAC IJ SOLR
0.5000 mL | Freq: Once | INTRAMUSCULAR | Status: AC
  Administered 2021-03-28: 0.5 mL via SUBCUTANEOUS
  Filled 2021-03-27: qty 0.5

## 2021-03-27 MED ORDER — SENNOSIDES-DOCUSATE SODIUM 8.6-50 MG PO TABS
2.0000 | ORAL_TABLET | Freq: Every day | ORAL | Status: DC
  Administered 2021-03-28: 2 via ORAL
  Filled 2021-03-27: qty 2

## 2021-03-27 MED ORDER — SIMETHICONE 80 MG PO CHEW: 80.0000 mg | CHEWABLE_TABLET | ORAL | Status: DC | PRN

## 2021-03-27 MED ORDER — DIBUCAINE (PERIANAL) 1 % EX OINT: 1.0000 "application " | TOPICAL_OINTMENT | CUTANEOUS | Status: DC | PRN

## 2021-03-27 MED ORDER — IBUPROFEN 600 MG PO TABS
600.0000 mg | ORAL_TABLET | Freq: Four times a day (QID) | ORAL | Status: DC
  Administered 2021-03-27 – 2021-03-28 (×6): 600 mg via ORAL
  Filled 2021-03-27 (×6): qty 1

## 2021-03-27 MED ORDER — ONDANSETRON HCL 4 MG PO TABS: 4.0000 mg | ORAL_TABLET | ORAL | Status: DC | PRN

## 2021-03-27 NOTE — MAU Note (Signed)
FHR 150

## 2021-03-27 NOTE — Lactation Note (Signed)
This note was copied from a baby's chart. Lactation Consultation Note  Patient Name: Natalie Pacheco [id number] Date: 03/27/2021 Reason for consult: Initial assessment;Maternal endocrine disorder Age:21 hours  P2, Baby has been spitty and sleepy. Unwrapped baby.  Placed STS and attempted to latch. Mother has good flow of colostrum with hand expression. Baby not interested in latching at this time. Suggest leaving baby STS and attempt latching if baby demonstrated cues.  Continue spoon feeding until baby latches and pump q 3 hours. Mother states she knows she had difficulty latching with first child and weight loss and wants to be proactive and begin pumping early.  She also states she will supplement with formula as needed. Mom made aware of O/P services, breastfeeding support groups, community resources, and our phone # for post-discharge questions.    Maternal Data Has patient been taught Hand Expression?: Yes Does the patient have breastfeeding experience prior to this delivery?: Yes How long did the patient breastfeed?: 3 mos.  Feeding Mother's Current Feeding Choice: Breast Milk and Formula  Lactation Tools Discussed/Used Tools: Pump Breast pump type: Double-Electric Breast Pump;Manual Pump Education: Milk Storage;Setup, frequency, and cleaning Reason for Pumping:  (Mother's request, stimulation) Pumping frequency: q 3 hours  Interventions Interventions: Breast feeding basics reviewed;Assisted with latch;Skin to skin;Hand express;Adjust position;Support pillows;DEBP;Education;LC Services brochure  Discharge Pump: DEBP ([place])  Consult Status Consult Status: Follow-up Date: 03/28/21 Follow-up type: In-patient    Dahlia Byes [facility] 03/27/2021, 9:03 AM

## 2021-03-27 NOTE — Discharge Summary (Addendum)
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 yo G2P1001 at [redacted]w[redacted]d was admitted in Active Labor on 03/26/2021. Patient had an uncomplicated labor course as follows:  Membrane Rupture Time/Date: 12:45 AM ,03/27/2021   Delivery Method: Vaginal, Spontaneous  Episiotomy: None  Lacerations:  1st degree  Patient had an uncomplicated postpartum course. Her fasting CBG on PPD#2 was 149.  She will have a GTT at her postpartum visit.  She is ambulating, tolerating a regular diet, passing flatus, and urinating well. Patient is discharged home in stable condition on 03/28/21.  Newborn Data: Birth date:03/27/2021  Birth time:12:47 AM  Gender:Female  Living status:Living  Apgars:9 ,9  Weight:3610 g   Magnesium Sulfate received: No BMZ received: No Rhophylac: N/A MMR: Offered postpartum  T-DaP: Given prenatally Flu: Given prenatally  Transfusion: No   Physical exam  Vitals:   03/27/21 1132 03/27/21 1339 03/27/21 2041 03/28/21 0519  BP: 131/71 122/70 118/66 111/69  Pulse: 63 66 69 63  Resp:  $Remo'17 16 17  'BbKwB$ Temp:  98.6 F (37 C) 98.9 F  (37.2 C) 98.1 F (36.7 C)  TempSrc:  Oral Oral Oral  SpO2:  100% 98% 100%  Weight:      Height:       General: alert, cooperative, and no distress Lochia: appropriate Uterine Fundus: firm and below umbilicus  Incision: N/A DVT Evaluation: No cords or calf tenderness. No significant calf/ankle edema.  Labs: Lab Results  Component Value Date   WBC 17.1 (H) 03/27/2021   HGB 13.9 03/27/2021   HCT 43.3 03/27/2021   MCV 74.4 (L) 03/27/2021   PLT 315 03/27/2021   CMP Latest Ref Rng & Units 09/14/2020  Glucose 70 - 99 mg/dL 77  BUN 6 - 20 mg/dL 8  Creatinine 0.44 - 1.00 mg/dL 0.63  Sodium 135 - 145 mmol/L 137  Potassium 3.5 - 5.1 mmol/L 3.8  Chloride 98 - 111 mmol/L 107  CO2 22 - 32 mmol/L 17(L)  Calcium 8.9 - 10.3 mg/dL 9.8  Total Protein 6.5 - 8.1 g/dL 7.8  Total Bilirubin 0.3 - 1.2 mg/dL 0.6  Alkaline Phos 38 - 126 U/L 59  AST 15 - 41 U/L 22  ALT 0 - 44 U/L 22   Edinburgh Score: Edinburgh Postnatal Depression Scale Screening Tool 03/27/2021  I have been able to laugh and see the funny side of things. 0  I have looked forward with enjoyment to things. 0  I have blamed myself  unnecessarily when things went wrong. 1  I have been anxious or worried for no good reason. 0  I have felt scared or panicky for no good reason. 0  Things have been getting on top of me. 1  I have been so unhappy that I have had difficulty sleeping. 0  I have felt sad or miserable. 0  I have been so unhappy that I have been crying. 1  The thought of harming myself has occurred to me. 0  Edinburgh Postnatal Depression Scale Total 3     After visit meds:  Allergies as of 03/28/2021   No Known Allergies      Medication List     STOP taking these medications    Accu-Chek Guide test strip Generic drug: glucose blood   Accu-Chek Guide w/Device Kit   Accu-Chek Softclix Lancets lancets   aspirin EC 81 MG tablet   calcium carbonate 500 MG chewable tablet Commonly known as: TUMS - dosed in  mg elemental calcium   Gojji Weight Scale Misc       TAKE these medications    acetaminophen 500 MG tablet Commonly known as: TYLENOL Take 2 tablets (1,000 mg total) by mouth every 8 (eight) hours as needed (pain).   albuterol 108 (90 Base) MCG/ACT inhaler Commonly known as: VENTOLIN HFA Inhale 1-2 puffs into the lungs every 6 (six) hours as needed for wheezing or shortness of breath.   albuterol (2.5 MG/3ML) 0.083% nebulizer solution Commonly known as: PROVENTIL Take 2.5 mg by nebulization every 6 (six) hours as needed for wheezing or shortness of breath.   ibuprofen 600 MG tablet Commonly known as: ADVIL Take 1 tablet (600 mg total) by mouth every 6 (six) hours as needed (pain).   prenatal vitamin w/FE, FA 27-1 MG Tabs tablet Take 1 tablet by mouth daily at 12 noon.         Discharge home in stable condition Infant Feeding: Bottle and Breast Infant Disposition: home with mother Discharge instruction: per After Visit Summary and Postpartum booklet. Activity: Advance as tolerated. Pelvic rest for 6 weeks.  Diet: routine diet Future Appointments: Future Appointments  Date Time Provider [place]  04/09/2021  1:15 PM [facility]  05/08/2021  8:15 AM Gabriel Carina, CNM [facility]  05/08/2021  8:50 AM [facility]   Follow up Visit: Message sent to [facility] by Dr. Gwenlyn Perking on 03/27/20.  Please schedule this patient for a In person postpartum visit in 6 weeks with the following provider: Any provider. Additional Postpartum F/U: 2 hour GTT  High risk pregnancy complicated by: GDM Delivery mode:  Vaginal, Spontaneous  Anticipated Birth Control:  Depo  03/28/2021 Genia Del, MD

## 2021-03-27 NOTE — Lactation Note (Signed)
This note was copied from a baby's chart. Lactation Consultation Note  Patient Name: Natalie Pacheco OFBPZ'W Date: 03/27/2021 Reason for consult: Follow-up assessment;Difficult latch;Term;Mother's request;Maternal endocrine disorder Age:21 hours LC came in to see if latch improved, previously had some nipple pain. Mom recent feeding at 1800 for 15 min followed by 5 ml of DBM. Mom denied any pain with the latch.  Mom using hand pump or hand expression to offer eBM on spoon before DBM, getting small amounts. Mom encouraged to continue stimulation with each feeding.   Mom to call for latch assistance with RN or LC for next feeding.   All questions answered at the end of the visit.   Maternal Data    Feeding Mother's Current Feeding Choice: Breast Milk and Donor Milk  LATCH Score Latch: Grasps breast easily, tongue down, lips flanged, rhythmical sucking.  Audible Swallowing: A few with stimulation  Type of Nipple: Everted at rest and after stimulation  Comfort (Breast/Nipple): Soft / non-tender  Hold (Positioning): Assistance needed to correctly position infant at breast and maintain latch.  LATCH Score: 8   Lactation Tools Discussed/Used Tools: Pump;Flanges Breast pump type: Manual Reason for Pumping: increase stimulation (LC arrived to see how latch coming along. Mom had recent feeding at 1800 15 min latch and 5 ml of DBM. Mom encouraged to use manual pump q 3hrs for 10 min offer EBM before DBM via spoon.) Pumping frequency: every 3 hrs for 10 min  Interventions Interventions: Breast feeding basics reviewed;Education;Expressed milk;Pace feeding;Infant Driven Feeding Algorithm education;Hand pump  Discharge    Consult Status Consult Status: Follow-up Date: 03/28/21 Follow-up type: In-patient    Natalie Sanderford  Pacheco 03/27/2021, 7:35 PM

## 2021-03-28 ENCOUNTER — Inpatient Hospital Stay (HOSPITAL_COMMUNITY): Admission: AD | Admit: 2021-03-28 | Payer: Medicaid Other | Source: Home / Self Care | Admitting: Family Medicine

## 2021-03-28 ENCOUNTER — Ambulatory Visit (HOSPITAL_COMMUNITY): Payer: Medicaid Other

## 2021-03-28 ENCOUNTER — Inpatient Hospital Stay (HOSPITAL_COMMUNITY): Payer: Medicaid Other

## 2021-03-28 LAB — GLUCOSE, CAPILLARY: Glucose-Capillary: 149 mg/dL — ABNORMAL HIGH (ref 70–99)

## 2021-03-28 MED ORDER — ACETAMINOPHEN 500 MG PO TABS: 1000.0000 mg | ORAL_TABLET | Freq: Three times a day (TID) | ORAL | 0 refills | Status: DC | PRN

## 2021-03-28 MED ORDER — IBUPROFEN 600 MG PO TABS: 600.0000 mg | ORAL_TABLET | Freq: Four times a day (QID) | ORAL | 0 refills | Status: DC | PRN

## 2021-03-28 NOTE — Lactation Note (Addendum)
This note was copied from a baby's chart. Lactation Consultation Note  Patient Name: Natalie Pacheco JOINO'M Date: 03/28/2021 Reason for consult: Follow-up assessment Age:21 hours P2, 39 hour term female infant, -5% weight loss. LC did not observe latch, infant was circumcised earlier today, per mom, infant was given 15 mls of donor breast milk at 1430 pm. Mom understands she can not take donor breast milk home, she plans to continue to breastfeed infant and supplement infant with formula once home. Mom is working on infant sustaining his latch and extending his lower jaw when latching infant at the breast, mom plans to supplement infant with any EBM first and then formula. LC encourage mom to use her Personal DEBP at home instead of hand pump, to help stimulate and establish her milk supply, mom understands to pump every 3 hours for 15 minutes on initial setting. LC discussed community support once discharge such as: LC hotline, LC outpatient  services, LC online support group. LC reviewed how you know breastfeeding is going well with infant, signs of dehydration and prevent and engorgement prevention and treatment. LC gave mom online resource: "[employer]".  Mom has written copy of below's recommendation and community resources. Mom's  LC discharge plan:  1- Mom will latch infant  with every feeding and afterward supplement infant with EBM/Donor breast milk  for each feeding until infant regains birth weight and is latching well at the breast.. 2- Mom will use her Personal DEBP every 3 hours for 15 minutes on initial setting to help stimulate and establish milk supply as she continues to improve infant's latch. 3- Mom knows to call if she has further BF questions, concerns or need further assistance with latching infant at the breast with discharge resources. [facility] outpatient clinic and use online resource for continued breastfeed suppiort.  Maternal Data    Feeding Mother's Current  Feeding Choice: Breast Milk and Donor Milk  LATCH Score                    Lactation Tools Discussed/Used    Interventions Interventions: DEBP;Position options;Breast compression;Skin to skin  Discharge Discharge Education: Engorgement and breast care;Warning signs for feeding baby;Outpatient recommendation  Consult Status Consult Status: Complete (mother declined follow up) Follow-up type: Physician    Danelle Earthly 03/28/2021, 4:20 PM

## 2021-03-28 NOTE — Progress Notes (Signed)
Patient would like to wait on depo shot  prior to discharge due to breast feeding just to be sure that it does not cause problems with her milk supply after speaking with lactation. Patient states she will decide at her postpartum check whether she would like depo or something else. Earl Gala, Linda [facility] [place]

## 2021-03-28 NOTE — Lactation Note (Signed)
This note was copied from a baby's chart. Lactation Consultation Note  Patient Name: Natalie Pacheco OLMBE'M Date: 03/28/2021 Reason for consult: Follow-up assessment;Term Age:21 hours  Maternal Data Does the patient have breastfeeding experience prior to this delivery?: Yes How long did the patient breastfeed?: 77mo  Feeding Mother's Current Feeding Choice: Breast Milk and Donor Milk Nipple Type: Slow - flow  Mom description of bottle feeding consistent  with fast flow nipple. Slower flow nipple given (Nfant standard nipple). Mom has no other concerns. Advised to f/u w/ LC if needed.    Discharge Pump: Personal  Consult Status Consult Status: Complete    Lawanda Cousins 03/28/2021, 10:48 AM

## 2021-04-09 ENCOUNTER — Ambulatory Visit: Payer: Medicaid Other | Admitting: Clinical

## 2021-04-09 DIAGNOSIS — Z91199 Patient's noncompliance with other medical treatment and regimen due to unspecified reason: Secondary | ICD-10-CM

## 2021-04-11 ENCOUNTER — Telehealth (HOSPITAL_COMMUNITY): Payer: Self-pay | Admitting: *Deleted

## 2021-04-11 NOTE — Telephone Encounter (Signed)
Attempted Hospital Discharge Follow-Up Call.  Left voice mail requesting that patient return RN's phone call.  

## 2021-05-08 ENCOUNTER — Ambulatory Visit: Payer: Self-pay | Admitting: Certified Nurse Midwife

## 2021-05-08 ENCOUNTER — Other Ambulatory Visit: Payer: Medicaid Other

## 2021-05-08 ENCOUNTER — Other Ambulatory Visit: Payer: Self-pay

## 2021-05-08 DIAGNOSIS — O24410 Gestational diabetes mellitus in pregnancy, diet controlled: Secondary | ICD-10-CM

## 2021-05-08 NOTE — Progress Notes (Unsigned)
° ° °  Post Partum Visit Note  Natalie Pacheco is a 21 y.o. G7P2002 female who presents for a postpartum visit. She is {1-10:13787} {time; units:18646} postpartum following a {method of delivery:313099}.  I have fully reviewed the prenatal and intrapartum course. The delivery was at *** gestational weeks.  Anesthesia: {anesthesia types:812}. Postpartum course has been ***. Baby is doing well***. Baby is feeding by {breast/bottle:69}. Bleeding {vag bleed:12292}. Bowel function is {normal:32111}. Bladder function is {normal:32111}. Patient {is/is not:9024} sexually active. Contraception method is {contraceptive method:5051}. Postpartum depression screening: {gen negative/positive:315881}.   The pregnancy intention screening data noted above was reviewed. Potential methods of contraception were discussed. The patient elected to proceed with No data recorded.    Health Maintenance Due  Topic Date Due   FOOT EXAM  Never done   OPHTHALMOLOGY EXAM  Never done   URINE MICROALBUMIN  Never done   PAP-Cervical Cytology Screening  04/29/2021   PAP SMEAR-Modifier  04/29/2021    {Common ambulatory SmartLinks:19316}  Review of Systems {ros; complete:30496}  Objective:  LMP 06/18/2020    General:  {gen appearance:16600}   Breasts:  {desc; normal/abnormal/not indicated:14647}  Lungs: {lung exam:16931}  Heart:  {heart exam:5510}  Abdomen: {abdomen exam:16834}   Wound {Wound assessment:11097}  GU exam:  {desc; normal/abnormal/not indicated:14647}       Assessment:    There are no diagnoses linked to this encounter.  *** postpartum exam.   Plan:   Essential components of care per ACOG recommendations:  1.  Mood and well being: Patient with {gen negative/positive:315881} depression screening today. Reviewed local resources for support.  - Patient tobacco use? {tobacco use:25506}  - hx of drug use? {yes/no:25505}    2. Infant care and feeding:  -Patient currently breastmilk feeding?  {yes/no:25502}  -Social determinants of health (SDOH) reviewed in EPIC. No concerns***The following needs were identified***  3. Sexuality, contraception and birth spacing - Patient {DOES_DOES PTW:65681} want a pregnancy in the next year.  Desired family size is {NUMBER 1-10:22536} children.  - Reviewed forms of contraception in tiered fashion. Patient desired {PLAN CONTRACEPTION:313102} today.   - Discussed birth spacing of 18 months  4. Sleep and fatigue -Encouraged family/partner/community support of 4 hrs of uninterrupted sleep to help with mood and fatigue  5. Physical Recovery  - Discussed patients delivery and complications. She describes her labor as {description:25511} - Patient had a {CHL AMB DELIVERY:[phone]}. Patient had a {laceration:25518} laceration. Perineal healing reviewed. Patient expressed understanding - Patient has urinary incontinence? {yes/no:25515} - Patient {ACTION; IS/IS EXN:[id number]} safe to resume physical and sexual activity  6.  Health Maintenance - HM due items addressed {Yes or If no, why not?:20788} - Last pap smear No results found for: DIAGPAP Pap smear {done:10129} at today's visit.  -Breast Cancer screening indicated? {indicated:25516}  7. Chronic Disease/Pregnancy Condition follow up: {Follow up:25499}  - PCP follow up  Guy Begin, CMA [facility] [employer], [facility]

## 2024-01-11 ENCOUNTER — Ambulatory Visit: Payer: Self-pay | Admitting: *Deleted

## 2024-01-11 DIAGNOSIS — Z3200 Encounter for pregnancy test, result unknown: Secondary | ICD-10-CM

## 2024-01-11 DIAGNOSIS — Z3201 Encounter for pregnancy test, result positive: Secondary | ICD-10-CM

## 2024-01-11 DIAGNOSIS — J4520 Mild intermittent asthma, uncomplicated: Secondary | ICD-10-CM

## 2024-01-11 LAB — POCT PREGNANCY, URINE: Preg Test, Ur: POSITIVE — AB

## 2024-01-11 MED ORDER — PRENATAL PLUS VITAMIN/MINERAL 27-1 MG PO TABS: 1.0000 | ORAL_TABLET | Freq: Every day | ORAL | 11 refills | Status: AC

## 2024-01-11 MED ORDER — ALBUTEROL SULFATE HFA 108 (90 BASE) MCG/ACT IN AERS: 1.0000 | INHALATION_SPRAY | Freq: Four times a day (QID) | RESPIRATORY_TRACT | Status: AC | PRN

## 2024-01-11 NOTE — Progress Notes (Signed)
 Natalie Pacheco dropped off a urine for a pregnancy test which was positive. I called her and informed her of results. She reports sure LMP of 11/29/23 which makes her [redacted]w[redacted]d with EDD 09/04/24. I reviewed allergies and meds. She plans to go here again for prenatal care. I instructed her to call front desk for new ob visit. She requested PNV RX and Albuterol  inhaler. She reports having trouble getting RX thru PCP.  I discussed with Dr. Eldonna and RX for Albuterol  inhaler approved.  Rock Skip PEAK

## 2024-01-11 NOTE — Patient Instructions (Signed)

## 2024-02-09 ENCOUNTER — Telehealth

## 2024-02-09 DIAGNOSIS — Z3491 Encounter for supervision of normal pregnancy, unspecified, first trimester: Secondary | ICD-10-CM | POA: Diagnosis not present

## 2024-02-09 DIAGNOSIS — Z3A10 10 weeks gestation of pregnancy: Secondary | ICD-10-CM | POA: Diagnosis not present

## 2024-02-09 DIAGNOSIS — Z3490 Encounter for supervision of normal pregnancy, unspecified, unspecified trimester: Secondary | ICD-10-CM | POA: Insufficient documentation

## 2024-02-09 NOTE — Patient Instructions (Signed)

## 2024-02-09 NOTE — Progress Notes (Signed)
 New OB Intake  I connected with Natalie Pacheco  on 02/09/24 at  2:15 PM EST by telephone Video Visit and verified that I am speaking with the correct person using two identifiers. Nurse is located at [facility] and pt is located at vehicle, patient not driving.  I discussed the limitations, risks, security and privacy concerns of performing an evaluation and management service by telephone and the availability of in person appointments. I also discussed with the patient that there may be a patient responsible charge related to this service. The patient expressed understanding and agreed to proceed.  I explained I am completing New OB Intake today. We discussed EDD of 09/04/24 based on LMP of 11/29/23. Pt is G3P2002. I reviewed her allergies, medications and Medical/Surgical/OB history.    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 Delivery Plans Plans to deliver at [facility]. Discussed the nature of our practice with multiple providers including residents and students as well as female and female providers. Due to the size of the practice, the delivering provider may not be the same as those providing prenatal care.   Patient is interested in water birth.  MyChart/Babyscripts MyChart access verified. I explained pt will have some visits in office and some virtually. Babyscripts instructions given and order placed. Patient verifies receipt of registration text/e-mail. Account successfully created and app downloaded. If patient is a  candidate for Optimized scheduling, add to sticky note.   Blood Pressure Cuff/Weight Scale Patient plans to buy a blood pressure cuff. Explained after first prenatal appt pt will check weekly and document in Babyscripts. Patient does have weight scale.  Anatomy US  Explained first scheduled US  will be around 19 weeks. Anatomy US  to be scheduled after dating/viability ultrasound.  Is patient a CenteringPregnancy candidate?  Declined Declined due to Group setting  Is patient a Mom+Baby Combined Care candidate?  Not a candidate   If accepted, confirm patient does not intend to move from the area for at least 12 months, then notify Mom+Baby staff  Is patient a candidate for Babyscripts Optimization? No   First visit review I reviewed new OB appt with patient. Explained pt will be seen by Dr. Cresenzo at first visit 02/15/24 at 2:55. Discussed [place] genetic screening with patient. Pt desires Panorama. Routine prenatal labs and genetic testing needed at [facility] OB visit.   Last Pap Advised pt that provider may recommend first pap smear for routine screening.  Waddell LITTIE Burows, RN 02/09/2024  3:18 PM

## 2024-02-15 ENCOUNTER — Ambulatory Visit: Payer: Self-pay

## 2024-02-15 ENCOUNTER — Other Ambulatory Visit: Payer: Self-pay | Admitting: Family Medicine

## 2024-02-15 ENCOUNTER — Other Ambulatory Visit: Payer: Self-pay

## 2024-02-15 ENCOUNTER — Encounter: Payer: Self-pay | Admitting: Family Medicine

## 2024-02-15 ENCOUNTER — Ambulatory Visit: Payer: Self-pay | Admitting: Family Medicine

## 2024-02-15 VITALS — BP 123/74 | HR 70 | Wt 157.1 lb

## 2024-02-15 DIAGNOSIS — O3680X0 Pregnancy with inconclusive fetal viability, not applicable or unspecified: Secondary | ICD-10-CM

## 2024-02-15 DIAGNOSIS — Z3490 Encounter for supervision of normal pregnancy, unspecified, unspecified trimester: Secondary | ICD-10-CM

## 2024-02-15 DIAGNOSIS — Z8632 Personal history of gestational diabetes: Secondary | ICD-10-CM

## 2024-02-15 DIAGNOSIS — Z8759 Personal history of other complications of pregnancy, childbirth and the puerperium: Secondary | ICD-10-CM

## 2024-02-15 DIAGNOSIS — Z3A11 11 weeks gestation of pregnancy: Secondary | ICD-10-CM

## 2024-02-15 DIAGNOSIS — I471 Supraventricular tachycardia, unspecified: Secondary | ICD-10-CM

## 2024-02-15 DIAGNOSIS — O30001 Twin pregnancy, unspecified number of placenta and unspecified number of amniotic sacs, first trimester: Secondary | ICD-10-CM

## 2024-02-15 DIAGNOSIS — O09291 Supervision of pregnancy with other poor reproductive or obstetric history, first trimester: Secondary | ICD-10-CM

## 2024-02-15 DIAGNOSIS — D563 Thalassemia minor: Secondary | ICD-10-CM

## 2024-02-15 DIAGNOSIS — Z3009 Encounter for other general counseling and advice on contraception: Secondary | ICD-10-CM

## 2024-02-16 ENCOUNTER — Other Ambulatory Visit

## 2024-02-16 LAB — COMPREHENSIVE METABOLIC PANEL WITH GFR
ALT: 15 IU/L (ref 0–32)
AST: 12 IU/L (ref 0–40)
Albumin: 4.3 g/dL (ref 4.0–5.0)
Alkaline Phosphatase: 54 IU/L (ref 41–116)
BUN/Creatinine Ratio: 12 (ref 9–23)
BUN: 7 mg/dL (ref 6–20)
Bilirubin Total: <0.2 mg/dL (ref 0.0–1.2)
CO2: 19 mmol/L — ABNORMAL LOW (ref 20–29)
Calcium: 9.9 mg/dL (ref 8.7–10.2)
Chloride: 104 mmol/L (ref 96–106)
Creatinine, Ser: 0.59 mg/dL (ref 0.57–1.00)
Globulin, Total: 2.5 g/dL (ref 1.5–4.5)
Glucose: 77 mg/dL (ref 70–99)
Potassium: 4 mmol/L (ref 3.5–5.2)
Sodium: 136 mmol/L (ref 134–144)
Total Protein: 6.8 g/dL (ref 6.0–8.5)
eGFR: 130 mL/min/1.73 (ref >59)

## 2024-02-16 LAB — CBC/D/PLT+RPR+RH+ABO+RUBIGG...
Antibody Screen: NEGATIVE
Basophils Absolute: 0.1 x10E3/uL (ref 0.0–0.2)
Basos: 1 %
EOS (ABSOLUTE): 0.1 x10E3/uL (ref 0.0–0.4)
Eos: 1 %
HCV Ab: NONREACTIVE
HIV Screen 4th Generation wRfx: NONREACTIVE
Hematocrit: 42.8 % (ref 34.0–46.6)
Hemoglobin: 13.2 g/dL (ref 11.1–15.9)
Hepatitis B Surface Ag: NEGATIVE
Immature Grans (Abs): 0 x10E3/uL (ref 0.0–0.1)
Immature Granulocytes: 0 %
Lymphocytes Absolute: 2.5 x10E3/uL (ref 0.7–3.1)
Lymphs: 12 %
MCH: 23.1 pg — ABNORMAL LOW (ref 26.6–33.0)
MCHC: 30.8 g/dL — ABNORMAL LOW (ref 31.5–35.7)
MCV: 75 fL — ABNORMAL LOW (ref 79–97)
Monocytes Absolute: 1.2 x10E3/uL — ABNORMAL HIGH (ref 0.1–0.9)
Monocytes: 6 %
Neutrophils Absolute: 16.5 x10E3/uL — ABNORMAL HIGH (ref 1.4–7.0)
Neutrophils: 80 %
Platelets: 398 x10E3/uL (ref 150–450)
RBC: 5.72 x10E6/uL — ABNORMAL HIGH (ref 3.77–5.28)
RDW: 14.8 % (ref 11.7–15.4)
RPR Ser Ql: NONREACTIVE
Rh Factor: POSITIVE
Rubella Antibodies, IGG: 2.61 {index} (ref >0.99)
WBC: 20.5 x10E3/uL (ref 3.4–10.8)

## 2024-02-16 LAB — HEMOGLOBIN A1C
Est. average glucose Bld gHb Est-mCnc: 111 mg/dL
Hgb A1c MFr Bld: 5.5 % (ref 4.8–5.6)

## 2024-02-16 LAB — HCV INTERPRETATION

## 2024-02-16 LAB — TSH: TSH: 2.32 u[IU]/mL (ref 0.450–4.500)

## 2024-02-16 MED ORDER — ASPIRIN 81 MG PO TBEC: 81.0000 mg | DELAYED_RELEASE_TABLET | Freq: Every day | ORAL | 12 refills | Status: AC

## 2024-02-16 NOTE — Progress Notes (Signed)
 Subjective:   Natalie Pacheco is a 23 y.o. G3P2002 at [redacted]w[redacted]d by LMP being seen today for her first obstetrical visit.  Her obstetrical history is significant for pregnancy induced hypertension and gestational diabetes. Patient does intend to breast feed. Pregnancy history fully reviewed.  Patient reports no complaints.  HISTORY: OB History  Gravida Para Term Preterm AB Living  3 2 2  0 0 2  SAB IAB Ectopic Multiple Live Births  0 0 0 0 2    # Outcome Date GA Lbr Len/2nd Weight Sex Type Anes PTL Lv  3 Current           2 Term 03/27/21 [redacted]w[redacted]d 03:45 / 00:02 7 lb 15.3 oz (3.61 kg) M Vag-Spont None  LIV     Name: Paske,BOY Satoya     Apgar1: 9  Apgar5: 9  1 Term 08/10/19 [redacted]w[redacted]d 16:12 / 07:17 8 lb 4.3 oz (3.751 kg) F Vag-Vacuum EPI  LIV     Name: Schnabel,GIRL Jazzmen     Apgar1: 7  Apgar5: 9   Last pap smear was never done Past Medical History:  Diagnosis Date   Abscess of buttock    ADHD (attention deficit hyperactivity disorder)    Asthma    DKA (diabetic ketoacidosis) (HCC) 06/16/2019   GDM (gestational diabetes mellitus), class A1 07/22/2019   Gestational diabetes    Marginal insertion of umbilical cord affecting management of mother 10/30/2020   Rubella non-immune status, antepartum 09/19/2020   Supervision of high risk pregnancy, antepartum 09/04/2020              Nursing Staff    Provider      Office Location     [facility]    Dating     LMP      Language       English    Anatomy US             Flu Vaccine     11/21/20    Genetic/Carrier Screen     NIPS: low risk    AFP:   normal  Horizon:      TDaP Vaccine     01/02/2021    Hgb A1C or   GTT    Early - 5.2  Third trimester       COVID Vaccine    decline         LAB RESULTS       Rhogam     n/a    Blo   SVT (supraventricular tachycardia) 06/21/2019   Asymptomatic, seen during DKA admission      Vaginal delivery 03/27/2021   Past Surgical History:  Procedure Laterality Date   CYST EXCISION N/A 07/17/2017   Procedure:  EXCISION OF INFECTED GLUTEAL CYST;  Surgeon: Signe Mitzie LABOR, MD;  Location:  [facility];  Service: General;  Laterality: N/A;   DENTAL SURGERY     INCISION AND DRAINAGE ABSCESS     Buttocks   TONSILLECTOMY     Family History  Problem Relation Age of Onset   Hypertension Mother    Social History   Tobacco Use   Smoking status: Never    Passive exposure: Yes   Smokeless tobacco: Never   Tobacco comments:    Mother smokes outside of the home  Vaping Use   Vaping status: Never Used  Substance Use Topics   Alcohol use: No   Drug use: Never   No Known Allergies Current Outpatient Medications on File Prior to  Visit  Medication Sig Dispense Refill   albuterol  (PROVENTIL ) (2.5 MG/3ML) 0.083% nebulizer solution Take 2.5 mg by nebulization every 6 (six) hours as needed for wheezing or shortness of breath.     albuterol  (VENTOLIN  HFA) 108 (90 Base) MCG/ACT inhaler Inhale 1-2 puffs into the lungs every 6 (six) hours as needed for wheezing or shortness of breath. 6.7 g 46.7   Prenatal Vit-Fe Fumarate-FA (PRENATAL PLUS VITAMIN/MINERAL) 27-1 MG TABS Take 1 tablet by mouth daily at 6 (six) AM. 30 tablet 11   acetaminophen  (TYLENOL ) 500 MG tablet Take 2 tablets (1,000 mg total) by mouth every 8 (eight) hours as needed (pain). (Patient not taking: Reported on 02/09/2024) 60 tablet 0   prenatal vitamin w/FE, FA (PRENATAL 1 + 1) 27-1 MG TABS tablet Take 1 tablet by mouth daily at 12 noon. 30 tablet 6   No current facility-administered medications on file prior to visit.     Exam   Vitals:   02/15/24 1519  BP: 123/74  Pulse: 70  Weight: 157 lb 1.6 oz (71.3 kg)      System: General: well-developed, well-nourished female in no acute distress   Skin: normal coloration and turgor, no rashes   Neurologic: oriented, normal, negative, normal mood   Extremities: normal strength, tone, and muscle mass, ROM of all joints is normal   HEENT PERRLA, extraocular movement intact and  sclera clear, anicteric   Mouth/Teeth mucous membranes moist, pharynx normal without lesions and dental hygiene good   Neck supple and no masses   Cardiovascular: regular rate and rhythm   Respiratory:  no respiratory distress, normal breath sounds   Abdomen: soft, non-tender; bowel sounds normal; no masses,  no organomegaly     Assessment:   Pregnancy: [id number] Patient Active Problem List   Diagnosis Date Noted   Encounter for supervision of low-risk pregnancy, antepartum 02/09/2024   History of gestational hypertension 09/04/2020   History of gestational diabetes mellitus (GDM) in prior pregnancy, currently pregnant 09/04/2020   Alpha thalassemia silent carrier 04/06/2019   ADHD, 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 Encounter for supervision of low-risk pregnancy, antepartum (Primary) Ultrasound this morning showing fetal heart rate is appropriate.  Viable pregnancy.  Twin gestation. Recommended initiating ASA at 12 weeks given multifetal gestation. Planning on Pap smear with BCCP - US  MFM OB DETAIL +14 WK; Future - US  MFM OB DETAIL ADDL GEST +14 WK; Future  2. Twin gestation in first trimester, unspecified multiple gestation type Patient had viability ultrasound this morning showing twin gestation - Hemoglobin A1c - CBC/D/Plt+RPR+Rh+ABO+RubIgG... - TSH - Comp Met (CMET)  3. Alpha thalassemia silent carrier  4. History of gestational hypertension Blood pressure within normal limits today  5. History of gestational diabetes mellitus (GDM) in prior pregnancy, currently pregnant Collecting A1c today If abnormal will need early GTT  6. SVT (supraventricular tachycardia) Patient reports issues with SVT in the past but no recent issues.  7. Unwanted fertility Is planning on tubal ligation.  Is in the process of getting Medicaid so we will need to fill out forms at a future visit.  8. [redacted] weeks  gestation of pregnancy  9. Pregnancy - PANORAMA PRENATAL TEST   Initial labs drawn. Continue prenatal vitamins. Genetic Screening discussed, NIPS: ordered. Ultrasound discussed; fetal anatomic survey: ordered. Problem list reviewed and updated. The nature of [place] [facility] with multiple MDs and other Advanced Practice Providers  was explained to patient; also emphasized that residents, students are part of our team. Routine obstetric precautions reviewed. No follow-ups on file.

## 2024-02-29 ENCOUNTER — Ambulatory Visit: Payer: Self-pay | Admitting: Family Medicine

## 2024-02-29 ENCOUNTER — Encounter: Payer: Self-pay | Admitting: Family Medicine

## 2024-03-01 LAB — PANORAMA PRENATAL TEST FULL PANEL:PANORAMA TEST PLUS 5 ADDITIONAL MICRODELETIONS
TRISOMY 21 AGE-BASED RISK TEXT: 2.7
TRISOMY 21 RESULT TEXT: 5.2

## 2024-03-14 ENCOUNTER — Other Ambulatory Visit (HOSPITAL_COMMUNITY)
Admission: RE | Admit: 2024-03-14 | Discharge: 2024-03-14 | Disposition: A | Discharge status: Home / Self Care | Payer: Self-pay | Source: Ambulatory Visit | Attending: Student | Admitting: Student

## 2024-03-14 ENCOUNTER — Other Ambulatory Visit: Payer: Self-pay

## 2024-03-14 ENCOUNTER — Ambulatory Visit (INDEPENDENT_AMBULATORY_CARE_PROVIDER_SITE_OTHER): Admitting: Student

## 2024-03-14 VITALS — BP 122/56 | HR 86 | Wt 196.0 lb

## 2024-03-14 DIAGNOSIS — O30042 Twin pregnancy, dichorionic/diamniotic, second trimester: Secondary | ICD-10-CM | POA: Insufficient documentation

## 2024-03-14 DIAGNOSIS — Z3490 Encounter for supervision of normal pregnancy, unspecified, unspecified trimester: Secondary | ICD-10-CM

## 2024-03-14 DIAGNOSIS — Z8632 Personal history of gestational diabetes: Secondary | ICD-10-CM

## 2024-03-14 DIAGNOSIS — O09299 Supervision of pregnancy with other poor reproductive or obstetric history, unspecified trimester: Secondary | ICD-10-CM

## 2024-03-14 DIAGNOSIS — Z3A15 15 weeks gestation of pregnancy: Secondary | ICD-10-CM

## 2024-03-14 DIAGNOSIS — Z8759 Personal history of other complications of pregnancy, childbirth and the puerperium: Secondary | ICD-10-CM

## 2024-03-14 DIAGNOSIS — Z113 Encounter for screening for infections with a predominantly sexual mode of transmission: Secondary | ICD-10-CM | POA: Insufficient documentation

## 2024-03-15 LAB — CBC
Hematocrit: 41.4 % (ref 34.0–46.6)
Hemoglobin: 13 g/dL (ref 11.1–15.9)
MCH: 23.6 pg — ABNORMAL LOW (ref 26.6–33.0)
MCHC: 31.4 g/dL — ABNORMAL LOW (ref 31.5–35.7)
MCV: 75 fL — ABNORMAL LOW (ref 79–97)
Platelets: 341 x10E3/uL (ref 150–450)
RBC: 5.52 x10E6/uL — ABNORMAL HIGH (ref 3.77–5.28)
RDW: 15.3 % (ref 11.7–15.4)
WBC: 17.8 x10E3/uL — ABNORMAL HIGH (ref 3.4–10.8)

## 2024-03-15 LAB — PROTEIN / CREATININE RATIO, URINE
Creatinine, Urine: 261.1 mg/dL
Protein, Ur: 19 mg/dL
Protein/Creat Ratio: 73 mg/g{creat} (ref 0–200)

## 2024-03-15 LAB — AFP, SERUM, OPEN SPINA BIFIDA
AFP MoM: 1.55
AFP Value: 38 ng/mL
Gest. Age on Collection Date: 15.1 wk
Maternal Age At EDD: 24.3 a
OSBR Risk 1 IN: 4777
Test Results:: NEGATIVE
Weight: 196 [lb_av]

## 2024-03-15 LAB — GC/CHLAMYDIA PROBE AMP (~~LOC~~) NOT AT ARMC
Chlamydia: NEGATIVE
Comment: NEGATIVE
Comment: NORMAL
Neisseria Gonorrhea: NEGATIVE

## 2024-03-15 NOTE — Progress Notes (Signed)
 "  PRENATAL VISIT NOTE  Subjective:  Natalie Pacheco is a 23 y.o. G3P2002 at [redacted]w[redacted]d being seen today for ongoing prenatal care.  She is currently monitored for the following issues for this high-risk pregnancy and has Alpha thalassemia silent carrier; History of gestational hypertension; History of gestational diabetes mellitus (GDM) in prior pregnancy, currently pregnant; ADHD, predominantly inattentive type; Mild intermittent asthma without complication; and Encounter for supervision of low-risk pregnancy, antepartum on their problem list.  Patient reports no complaints.  Contractions: Not present. Vag. Bleeding: None.  Movement: Present. Denies leaking of fluid.   The following portions of the patient's history were reviewed and updated as appropriate: allergies, current medications, past family history, past medical history, past social history, past surgical history and problem list.   Objective:   Vitals:   03/14/24 1404  BP: (!) 122/56  Pulse: 86  Weight: 196 lb (88.9 kg)    Fetal Status:  Fetal Heart Rate (bpm): 148/130   Movement: Present    General: Alert, oriented and cooperative. Patient is in no acute distress.  Skin: Skin is warm and dry. No rash noted.   Cardiovascular: Normal heart rate noted  Respiratory: Normal respiratory effort, no problems with respiration noted  Abdomen: Soft, gravid, appropriate for gestational age.  Pain/Pressure: Absent     Pelvic: Cervical exam deferred        Extremities: Normal range of motion.  Edema: None  Mental Status: Normal mood and affect. Normal behavior. Normal judgment and thought content.      02/15/2024    4:08 PM 03/04/2021    3:20 PM 02/18/2021    4:24 PM  Depression screen PHQ 2/9  Decreased Interest 0 0 0  Down, Depressed, Hopeless 0 0 0  PHQ - 2 Score 0 0 0  Altered sleeping 0 0 0  Tired, decreased energy 0 0 0  Change in appetite 0 0 0  Feeling bad or failure about yourself  0 0 0  Trouble concentrating 0 0 0   Moving slowly or fidgety/restless 0 0 0  Suicidal thoughts 0 0 0  PHQ-9 Score 0 0  0      Data saved with a previous flowsheet row definition        02/15/2024    4:08 PM 03/04/2021    3:21 PM 02/18/2021    4:24 PM 01/02/2021    9:30 AM  GAD 7 : Generalized Anxiety Score  Nervous, Anxious, on Edge 0 0 0 0  Control/stop worrying 0 0 0 0  Worry too much - different things 0 0 0 0  Trouble relaxing 0 0 0 0  Restless 1 0 0 0  Easily annoyed or irritable 1 0 0 0  Afraid - awful might happen 0 0 0 0  Total GAD 7 Score 2 0 0 0    Assessment and Plan:  Pregnancy: G3P2002 at [redacted]w[redacted]d  1. Encounter for supervision of low-risk pregnancy, antepartum (Primary) - PANORAMA PRENATAL TEST - CBC - AFP, Serum, Open Spina Bifida - GC/Chlamydia probe amp ([place])not at [facility] - Protein / creatinine ratio, urine - Culture, OB Urine  2. Dichorionic diamniotic twin pregnancy in second trimester - Detailed anatomy scan scheduled 01/28 - PANORAMA PRENATAL TEST - CBC - AFP, Serum, Open Spina Bifida - GC/Chlamydia probe amp ([place])not at [facility] - Protein / creatinine ratio, urine - Culture, OB Urine  3. [redacted] weeks gestation of pregnancy - PANORAMA PRENATAL TEST - CBC - AFP, Serum, Open Spina  Bifida - GC/Chlamydia probe amp ([place])not at [facility] - Protein / creatinine ratio, urine - Culture, OB Urine  4. History of gestational hypertension - bASA therapy - normal BP today  5. History of gestational diabetes mellitus (GDM) in prior pregnancy, currently pregnant - Early A1c WDL   Preterm labor symptoms and general obstetric precautions including but not limited to vaginal bleeding, contractions, leaking of fluid and fetal movement were reviewed in detail with the patient. Please refer to After Visit Summary for other counseling recommendations.   No follow-ups on file.  Future Appointments  Date Time Provider Department Center  04/13/2024 10:00 AM [facility] PROVIDER 1 [facility]  [facility]  04/13/2024 10:30 AM [facility] US1 [facility]  04/14/2024  9:55 AM Cleatus Moccasin, MD [facility]  05/12/2024 10:55 AM [facility]GENERAL 1 [facility]  06/09/2024  8:20 AM [facility] LAB [facility]  06/09/2024  8:55 AM [facility]-GENERAL 1 [facility]  06/23/2024 10:55 AM [facility]-GENERAL 1 [facility]  07/07/2024 11:15 AM [facility]MC-GENERAL 1 [facility]  07/21/2024 11:15 AM [facility]MC-GENERAL 1 [facility]  08/04/2024 11:15 AM WMC-GENERAL 1 [facility]  08/11/2024 11:15 AM WMC-GENERAL 1 [facility]  08/18/2024 11:15 AM WMC-GENERAL 1 [facility]  08/25/2024 11:15 AM WMC-GENERAL 1 [facility]  09/01/2024 10:55 AM [facility]MC-GENERAL 1 [facility]    Nat Dauer, NP  "

## 2024-03-16 LAB — CULTURE, OB URINE

## 2024-03-16 LAB — URINE CULTURE, OB REFLEX

## 2024-03-19 ENCOUNTER — Ambulatory Visit: Payer: Self-pay | Admitting: Student

## 2024-03-21 LAB — PANORAMA PRENATAL TEST FULL PANEL:PANORAMA TEST PLUS 5 ADDITIONAL MICRODELETIONS
FETAL FRACTION SECOND FETUS: 6.7
FETAL FRACTION: 7.7

## 2024-04-06 DIAGNOSIS — Z0289 Encounter for other administrative examinations: Secondary | ICD-10-CM

## 2024-04-13 ENCOUNTER — Ambulatory Visit: Attending: Maternal & Fetal Medicine | Admitting: Maternal & Fetal Medicine

## 2024-04-13 ENCOUNTER — Ambulatory Visit (HOSPITAL_BASED_OUTPATIENT_CLINIC_OR_DEPARTMENT_OTHER)

## 2024-04-13 VITALS — BP 115/53 | HR 75

## 2024-04-13 DIAGNOSIS — O99512 Diseases of the respiratory system complicating pregnancy, second trimester: Secondary | ICD-10-CM | POA: Diagnosis not present

## 2024-04-13 DIAGNOSIS — J45909 Unspecified asthma, uncomplicated: Secondary | ICD-10-CM

## 2024-04-13 DIAGNOSIS — Z8632 Personal history of gestational diabetes: Secondary | ICD-10-CM | POA: Diagnosis not present

## 2024-04-13 DIAGNOSIS — O09292 Supervision of pregnancy with other poor reproductive or obstetric history, second trimester: Secondary | ICD-10-CM | POA: Diagnosis not present

## 2024-04-13 DIAGNOSIS — D563 Thalassemia minor: Secondary | ICD-10-CM

## 2024-04-13 DIAGNOSIS — O30042 Twin pregnancy, dichorionic/diamniotic, second trimester: Secondary | ICD-10-CM

## 2024-04-13 DIAGNOSIS — Z8759 Personal history of other complications of pregnancy, childbirth and the puerperium: Secondary | ICD-10-CM

## 2024-04-13 DIAGNOSIS — O09299 Supervision of pregnancy with other poor reproductive or obstetric history, unspecified trimester: Secondary | ICD-10-CM | POA: Diagnosis not present

## 2024-04-13 DIAGNOSIS — Z3490 Encounter for supervision of normal pregnancy, unspecified, unspecified trimester: Secondary | ICD-10-CM | POA: Diagnosis not present

## 2024-04-13 DIAGNOSIS — Z3A19 19 weeks gestation of pregnancy: Secondary | ICD-10-CM | POA: Insufficient documentation

## 2024-04-13 DIAGNOSIS — Z148 Genetic carrier of other disease: Secondary | ICD-10-CM | POA: Diagnosis not present

## 2024-04-13 DIAGNOSIS — O99012 Anemia complicating pregnancy, second trimester: Secondary | ICD-10-CM | POA: Diagnosis not present

## 2024-04-13 DIAGNOSIS — Z363 Encounter for antenatal screening for malformations: Secondary | ICD-10-CM | POA: Insufficient documentation

## 2024-04-13 NOTE — Progress Notes (Signed)
 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 G3P2002 at [redacted]w[redacted]d here for ultrasound and consultation. She had low risk aneuploidy screening of a female fetuses.  Today we focused on the following:   The patient is here today for a detailed anatomy ultrasound in the setting of a dichorionic diamniotic twin pregnancy. She reports no additional concerns at this time. Ultrasound today was performed to evaluate fetal anatomy and chorionicity.  She has a history of gestational diabetes in a prior pregnancy, which presented later in gestation at approximately 30 weeks with an atypical presentation of diabetic ketoacidosis. At that time, her hemoglobin A1c was elevated at 9.5, with positive ketones and elevated beta-hydroxybutyrate, requiring hospital admission. That pregnancy resulted in a vaginal delivery at 38 weeks and 4 days, complicated by vacuum-assisted delivery of an 8 lb 4 oz female infant. She reports that the child had no congenital abnormalities and is currently doing well. She subsequently had another term vaginal delivery. We discussed that a prior history of gestational diabetes significantly increases the risk of recurrence, particularly in the setting of a multifetal gestation. The patient acknowledges that her diet was poor during the affected pregnancy and  reports that she is now following a lower-carbohydrate, more balanced diet. I encouraged early glucose screening in this pregnancy given her history and twin gestation.  We reviewed the maternal and fetal risks associated with dichorionic diamniotic twin pregnancies, including fetal growth abnormalities, discordant growth, fetal growth restriction, excessive maternal or fetal weight gain, gestational diabetes, hypertensive disorders of pregnancy, and an increased likelihood of cesarean delivery. We discussed criteria for vaginal delivery, which generally include cephalic presentation of Twin A, typically assessed at 28-[redacted] weeks gestation, appropriate fetal growth, and Twin B not exceeding Twin As estimated fetal weight by more than 20%. In the absence of complications, delivery is typically recommended around [redacted] weeks gestation, or sooner if clinically indicated. We also discussed preeclampsia risk reduction, and I recommended low-dose aspirin , specifically two 81 mg tablets daily, given her twin pregnancy, as this dosing has been shown to be more effective even in singleton pregnancies.  The patient also has a history of asthma. She reports rare use of her albuterol  inhaler, is aware of her triggers, and actively avoids them. We discussed continued symptom monitoring. She was instructed to contact her OB provider if she requires albuterol  more than two days per week, or if she experiences nighttime symptoms, including wheezing or shortness of breath, as these would suggest suboptimal asthma control.  Sonographic findings Dichorionic diamniotic twin pregnancy at 19w 3d. Presentation: A: Cephalic, B: Breech. Fetal cardiac activity:  A: Observed, B: Observed and appears normal. Fetal anatomy: A: appears normal. B: appears within normal. Some of the fetal anatomy remains suboptimally visualized due to poor accoustic windows.  Fetal biometry shows: (estimated fetal weight): A: 22, B: 20 percentile.   Amniotic fluid: A: Within normal limits, B:  Within normal limits.  MVP: A: 6.21, B: 5.76 cm. Placenta: A: Posterior, B: Anterior.     Adnexa: No abnormality visualized. Cervical length: 3.6 cm.  RECOMMENDATIONS -Continue routine prenatal care for dichorionic diamniotic twin pregnancy -Obtain early glucose screening given prior gestational diabetes and twin gestation -Continue lower-carbohydrate, balanced diet and lifestyle modification -Perform serial growth ultrasounds every 4-6 weeks starting in the third trimester -Initiate weekly antenatal testing beginning at [redacted] weeks gestation -Plan delivery around [redacted] weeks gestation pending clinical course -Consider vaginal delivery if Twin A is cephalic and Twin B is not more than 20% larger than Twin A -Start low-dose aspirin  at a dose of two 81 mg tablets daily for preeclampsia risk reduction -Continue asthma trigger avoidance and albuterol  as needed -Notify OB provider if albuterol  is required more than two days per week or for nighttime symptoms  60 minutes of time was spent reviewing the patient's chart including labs, imaging and documentation.  At least 50% of this time was spent with direct patient care discussing the diagnosis, management and prognosis of her care.  Review of Systems: A review of systems was performed and was negative except per HPI   Past Obstetrical History:  OB History  Gravida Para Term Preterm AB Living  3 2 2  0 0 2  SAB IAB Ectopic Multiple Live Births  0 0 0 0 2    # Outcome Date GA Lbr Len/2nd Weight Sex Type Anes PTL Lv  3 Current           2 Term 03/27/21 [redacted]w[redacted]d 03:45 / 00:02 7 lb 15.3 oz (3.61 kg) M Vag-Spont None  LIV  1 Term 08/10/19 [redacted]w[redacted]d 16:12 / 07:17 8 lb 4.3 oz (3.751 kg) F Vag-Vacuum EPI  LIV     Past Medical History:  Past Medical History:  Diagnosis Date   Abscess of buttock    ADHD (attention deficit hyperactivity disorder)    Asthma    DKA (diabetic ketoacidosis) (HCC) 06/16/2019    GDM (gestational diabetes mellitus), class A1 07/22/2019   Gestational diabetes    Marginal insertion of umbilical cord affecting management of mother 10/30/2020   Rubella non-immune status, antepartum 09/19/2020   Supervision of high risk pregnancy, antepartum 09/04/2020              Nursing Staff    Provider      Office Location     [facility]    Dating     LMP      Language       English    Anatomy US             Flu Vaccine     11/21/20    Genetic/Carrier Screen     NIPS: low risk    AFP:   normal  Horizon:      TDaP Vaccine     01/02/2021    Hgb A1C or   GTT    Early - 5.2  Third trimester       COVID Vaccine    decline         LAB RESULTS       Rhogam     n/a    Blo   SVT (supraventricular tachycardia) 06/21/2019   Asymptomatic, seen during DKA admission      Vaginal delivery 03/27/2021     Past Surgical History:    Past Surgical History:  Procedure Laterality Date   CYST EXCISION N/A 07/17/2017   Procedure: EXCISION OF INFECTED GLUTEAL CYST;  Surgeon: Signe Mitzie LABOR, MD;  Location: [place];  Service: General;  Laterality: N/A;   DENTAL SURGERY     INCISION AND DRAINAGE ABSCESS     Buttocks   TONSILLECTOMY       Home Medications:   Medications Ordered Prior to Encounter[1]    Allergies:   Allergies[2]   Physical Exam:   Vitals:   04/13/24 1014  BP: (!) 115/53  Pulse: 75   Sitting comfortably on the sonogram table Nonlabored breathing Normal rate and rhythm Abdomen is nontender  Thank you for the opportunity to be involved with this patient's care. Please let us  know if we can be of any further assistance.   Delora Smaller MFM, [place]   04/13/2024  12:42 PM      [1]  Current Outpatient Medications on File Prior to Visit  Medication Sig Dispense Refill   albuterol  (VENTOLIN  HFA) 108 (90 Base) MCG/ACT inhaler Inhale 1-2 puffs into the lungs every 6 (six) hours as needed for wheezing or shortness of breath. 6.7 g 46.7   aspirin  EC 81 MG tablet  Take 1 tablet (81 mg total) by mouth daily. Swallow whole. 30 tablet 12   Prenatal Vit-Fe Fumarate-FA (PRENATAL PLUS VITAMIN/MINERAL) 27-1 MG TABS Take 1 tablet by mouth daily at 6 (six) AM. 30 tablet 11   acetaminophen  (TYLENOL ) 500 MG tablet Take 2 tablets (1,000 mg total) by mouth every 8 (eight) hours as needed (pain). (Patient not taking: Reported on 02/09/2024) 60 tablet 0   albuterol  (PROVENTIL ) (2.5 MG/3ML) 0.083% nebulizer solution Take 2.5 mg by nebulization every 6 (six) hours as needed for wheezing or shortness of breath.     prenatal vitamin w/FE, FA (PRENATAL 1 + 1) 27-1 MG TABS tablet Take 1 tablet by mouth daily at 12 noon. 30 tablet 6   No current facility-administered medications on file prior to visit.  [2] No Known Allergies  "

## 2024-04-14 ENCOUNTER — Other Ambulatory Visit: Payer: Self-pay

## 2024-04-14 ENCOUNTER — Encounter: Payer: Self-pay | Admitting: Obstetrics and Gynecology

## 2024-04-14 ENCOUNTER — Ambulatory Visit: Admitting: Obstetrics and Gynecology

## 2024-04-14 VITALS — BP 120/67 | HR 89 | Wt 193.9 lb

## 2024-04-14 DIAGNOSIS — O09299 Supervision of pregnancy with other poor reproductive or obstetric history, unspecified trimester: Secondary | ICD-10-CM

## 2024-04-14 DIAGNOSIS — D563 Thalassemia minor: Secondary | ICD-10-CM

## 2024-04-14 DIAGNOSIS — Z8759 Personal history of other complications of pregnancy, childbirth and the puerperium: Secondary | ICD-10-CM | POA: Diagnosis not present

## 2024-04-14 DIAGNOSIS — O09292 Supervision of pregnancy with other poor reproductive or obstetric history, second trimester: Secondary | ICD-10-CM | POA: Diagnosis not present

## 2024-04-14 DIAGNOSIS — O30042 Twin pregnancy, dichorionic/diamniotic, second trimester: Secondary | ICD-10-CM | POA: Diagnosis not present

## 2024-04-14 DIAGNOSIS — J45909 Unspecified asthma, uncomplicated: Secondary | ICD-10-CM

## 2024-04-14 DIAGNOSIS — O99512 Diseases of the respiratory system complicating pregnancy, second trimester: Secondary | ICD-10-CM | POA: Diagnosis not present

## 2024-04-14 DIAGNOSIS — Z3A19 19 weeks gestation of pregnancy: Secondary | ICD-10-CM

## 2024-04-14 DIAGNOSIS — Z8632 Personal history of gestational diabetes: Secondary | ICD-10-CM | POA: Diagnosis not present

## 2024-04-14 DIAGNOSIS — Z3009 Encounter for other general counseling and advice on contraception: Secondary | ICD-10-CM

## 2024-04-14 DIAGNOSIS — Z3490 Encounter for supervision of normal pregnancy, unspecified, unspecified trimester: Secondary | ICD-10-CM

## 2024-04-14 DIAGNOSIS — O30049 Twin pregnancy, dichorionic/diamniotic, unspecified trimester: Secondary | ICD-10-CM | POA: Insufficient documentation

## 2024-04-14 NOTE — Progress Notes (Signed)
 "  PRENATAL VISIT NOTE  Subjective:  Natalie Pacheco is a 24 y.o. G3P2002 at [redacted]w[redacted]d being seen today for ongoing prenatal care.  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  Patient reports no complaints.  Contractions: Not present. Vag. Bleeding: None.  Movement: Present. Denies leaking of fluid.   The following portions of the patient's history were reviewed and updated as appropriate: allergies, current medications, past family history, past medical history, past social history, past surgical history and problem list.   Objective:   Vitals:   04/14/24 1014  BP: 120/67  Pulse: 89  Weight: 193 lb 14.4 oz (88 kg)    Fetal Status:  Fetal Heart Rate (bpm): 147/141   Movement: Present    General: Alert, oriented and cooperative. Patient is in no acute distress.  Skin: Skin is warm and dry. No rash noted.   Cardiovascular: Normal heart rate noted  Respiratory: Normal respiratory effort, no problems with respiration noted  Abdomen: Soft, gravid, appropriate for gestational age.  Pain/Pressure: Absent     Pelvic: Cervical exam deferred        Extremities: Normal range of motion.  Edema: None  Mental Status: Normal mood and affect. Normal behavior. Normal judgment and thought content.   Assessment and Plan:  Pregnancy: G3P2002 at [redacted]w[redacted]d Encounter for supervision of low-risk pregnancy, antepartum Overview: Offered flu shot - pt declines Anatomy normal.  AFP normal.   Pregnancy with 19 completed weeks gestation  Dichorionic diamniotic twin pregnancy,  antepartum Overview: Criteria for vaginal delivery (generally): Cephalic presentation of Twin A, appropriate fetal growth, and Twin B not exceeding Twin As estimated fetal weight by more than 20%   History of gestational diabetes mellitus (GDM) in prior pregnancy, currently pregnant Overview: G1 - Was on insulin , Had DKA once, had VAVD G2 - unaffected A1c 5.5 at NOB 2 hr to be scheduled for next week.   Alpha thalassemia silent carrier Overview: [x]  FOB testing/GC - would prefer neonatal testing.   Asthma affecting pregnancy in second trimester  History of gestational hypertension Overview: vs [place] 2021 pregnancy Baseline labs wnl, P/C normal ldASA  Unwanted fertility Overview: [ ]  sign tubal papers at 28w.  Discussed alternatives including LARC options and vasectomy. She declines these options.    Preterm labor symptoms and general obstetric precautions including but not limited to vaginal bleeding, contractions, leaking of fluid and fetal movement were reviewed in detail with the patient. Please refer to After Visit Summary for other counseling recommendations.   Return in about 4 weeks (around 05/12/2024) for OB VISIT, MD or APP.  Future Appointments  Date Time Provider Department Center  05/12/2024 10:55 AM Eveline Lynwood MATSU, MD [facility]  05/19/2024 10:15 AM [facility] PROVIDER 1 [facility]  05/19/2024 10:30 AM [facility] US1 [facility]  05/19/2024  6:05 PM [facility]-CERVICAL SCREENING [facility] None  06/09/2024  8:20 AM [facility] LAB [facility]  06/09/2024  8:55 AM [facility]MC-GENERAL 1 [facility]  06/23/2024 10:55 AM [facility]MC-GENERAL 1 [facility]  07/07/2024 11:15 AM WMC-GENERAL 1 [facility]  07/21/2024 11:15 AM WMC-GENERAL 1 [facility]  08/04/2024 11:15 AM WMC-GENERAL 1 [facility]  08/11/2024 11:15 AM WMC-GENERAL 1 [facility]  08/18/2024 11:15 AM WMC-GENERAL 1 [facility]  08/25/2024 11:15 AM WMC-GENERAL 1 [facility]  09/01/2024 10:55 AM WMC-GENERAL 1 [facility]    Vina Solian,  MD "

## 2024-04-19 ENCOUNTER — Other Ambulatory Visit: Payer: Self-pay

## 2024-04-19 DIAGNOSIS — O09299 Supervision of pregnancy with other poor reproductive or obstetric history, unspecified trimester: Secondary | ICD-10-CM

## 2024-04-21 ENCOUNTER — Other Ambulatory Visit: Payer: Self-pay

## 2024-04-21 DIAGNOSIS — O09299 Supervision of pregnancy with other poor reproductive or obstetric history, unspecified trimester: Secondary | ICD-10-CM

## 2024-04-22 LAB — GLUCOSE TOLERANCE, 2 HOURS W/ 1HR
Glucose, 1 hour: 147 mg/dL (ref 70–179)
Glucose, 2 hour: 148 mg/dL (ref 70–152)
Glucose, Fasting: 70 mg/dL (ref 70–91)

## 2024-05-12 ENCOUNTER — Encounter: Admitting: Obstetrics & Gynecology

## 2024-05-19 ENCOUNTER — Ambulatory Visit: Payer: Self-pay

## 2024-05-19 ENCOUNTER — Ambulatory Visit

## 2024-05-19 ENCOUNTER — Other Ambulatory Visit

## 2024-06-09 ENCOUNTER — Encounter: Admitting: Family Medicine

## 2024-06-09 ENCOUNTER — Other Ambulatory Visit

## 2024-06-23 ENCOUNTER — Encounter: Admitting: Family Medicine

## 2024-07-07 ENCOUNTER — Encounter

## 2024-07-21 ENCOUNTER — Encounter

## 2024-08-04 ENCOUNTER — Encounter

## 2024-08-11 ENCOUNTER — Encounter

## 2024-08-18 ENCOUNTER — Encounter

## 2024-08-25 ENCOUNTER — Encounter

## 2024-09-01 ENCOUNTER — Encounter
